# Patient Record
Sex: Male | Born: 1975 | Race: White | Hispanic: No | Marital: Married | Smoking: Never smoker
Health system: Southern US, Community
[De-identification: ages and names within clinical notes are randomized; demographics above are authoritative.]

## PROBLEM LIST (undated history)

## (undated) DIAGNOSIS — K219 Gastro-esophageal reflux disease without esophagitis: Secondary | ICD-10-CM

## (undated) DIAGNOSIS — J45909 Unspecified asthma, uncomplicated: Secondary | ICD-10-CM

## (undated) DIAGNOSIS — I10 Essential (primary) hypertension: Secondary | ICD-10-CM

## (undated) DIAGNOSIS — K589 Irritable bowel syndrome without diarrhea: Secondary | ICD-10-CM

## (undated) HISTORY — DX: Unspecified asthma, uncomplicated: J45.909

## (undated) HISTORY — DX: Gastro-esophageal reflux disease without esophagitis: K21.9

## (undated) HISTORY — DX: Irritable bowel syndrome, unspecified: K58.9

## (undated) HISTORY — DX: Essential (primary) hypertension: I10

## (undated) HISTORY — PX: TONSILLECTOMY: SUR1361

## (undated) HISTORY — PX: ROOT CANAL: SHX2363

---

## 2003-07-13 ENCOUNTER — Emergency Department (HOSPITAL_COMMUNITY): Admission: EM | Admit: 2003-07-13 | Discharge: 2003-07-13 | Payer: Self-pay | Admitting: Family Medicine

## 2013-02-17 ENCOUNTER — Emergency Department (HOSPITAL_COMMUNITY)
Admission: EM | Admit: 2013-02-17 | Discharge: 2013-02-17 | Disposition: A | Discharge status: Home / Self Care | Payer: PRIVATE HEALTH INSURANCE | Attending: Emergency Medicine | Admitting: Emergency Medicine

## 2013-02-17 ENCOUNTER — Encounter (HOSPITAL_COMMUNITY): Payer: Self-pay | Admitting: Emergency Medicine

## 2013-02-17 DIAGNOSIS — K089 Disorder of teeth and supporting structures, unspecified: Secondary | ICD-10-CM | POA: Insufficient documentation

## 2013-02-17 DIAGNOSIS — K0889 Other specified disorders of teeth and supporting structures: Secondary | ICD-10-CM

## 2013-02-17 MED ORDER — PENICILLIN V POTASSIUM 250 MG PO TABS
500.0000 mg | ORAL_TABLET | Freq: Once | ORAL | Status: AC
  Administered 2013-02-17: 500 mg via ORAL
  Filled 2013-02-17: qty 2

## 2013-02-17 MED ORDER — PENICILLIN V POTASSIUM 500 MG PO TABS: 500.0000 mg | ORAL_TABLET | Freq: Three times a day (TID) | ORAL | Status: DC

## 2013-02-17 MED ORDER — HYDROCODONE-ACETAMINOPHEN 5-325 MG PO TABS
1.0000 | ORAL_TABLET | Freq: Once | ORAL | Status: DC
  Filled 2013-02-17: qty 1

## 2013-02-17 MED ORDER — HYDROCODONE-ACETAMINOPHEN 5-325 MG PO TABS: 1.0000 | ORAL_TABLET | ORAL | Status: DC | PRN

## 2013-02-17 NOTE — ED Notes (Signed)
Pt c/o left upper toothache x 5 weeks. Pt reports broke tooth about 3 months ago. Pt has tried over the counter medication without relief.

## 2013-02-17 NOTE — ED Provider Notes (Signed)
CSN: [id number]     Arrival date & time 02/17/13  7829 History   First MD Initiated Contact with Patient 02/17/13 0840     No chief complaint on file.   HPI  Patient has a painful tooth. He states it broke about 3 months ago. Then progressively more sore. Started getting some intermittent sensation of swelling in his face over the last week. He called a scheduled appointment for a root canal. This is one week from today. States and more painful and he presents stating "I was hoping to get some antibiotics because it's getting worse".  No facial swelling. No difficult with swallowing or speech.  No past medical history on file. No past surgical history on file. No family history on file. History  Substance Use Topics  . Smoking status: Not on file  . Smokeless tobacco: Not on file  . Alcohol Use: Not on file    Review of Systems  HENT: Positive for dental problem. Negative for drooling, facial swelling and trouble swallowing.     Allergies  Review of patient's allergies indicates no known allergies.  Home Medications   Current Outpatient Rx  Name  Route  Sig  Dispense  Refill  . ibuprofen (ADVIL,MOTRIN) 200 MG tablet   Oral   Take 400 mg by mouth every 6 (six) hours as needed for moderate pain.         Marland Kitchen HYDROcodone-acetaminophen (NORCO/VICODIN) 5-325 MG per tablet   Oral   Take 1 tablet by mouth every 4 (four) hours as needed.   10 tablet   0   . penicillin v potassium (VEETID) 500 MG tablet   Oral   Take 1 tablet (500 mg total) by mouth 3 (three) times daily.   30 tablet   0    BP 142/104  Pulse 58  Temp(Src) 98.2 F (36.8 C) (Oral)  Resp 18  SpO2 98% Physical Exam  Physical exam shows him sitting upright. No acute distress. No facial swelling. No swelling or induration of the neck. No adenopathy in the neck. He has a fractured first molar. There is no periodontal reaction or swelling. No sign of. No abscess. No swelling medially. Adjacent teeth appear  normal. ED Course  Procedures (including critical care time) Labs Review Labs Reviewed - No data to display Imaging Review No results found.  EKG Interpretation   None       MDM   1. Pain, dental    No obvious gingival swelling. The tooth is stable. May be simple periodontitis infection. May be apical abscess. He has a scheduled appointment one week from today.    Roney Marion, MD 02/17/13 [phone]

## 2014-01-20 ENCOUNTER — Encounter (HOSPITAL_COMMUNITY): Payer: Self-pay | Admitting: Cardiology

## 2014-01-20 ENCOUNTER — Emergency Department (HOSPITAL_COMMUNITY)
Admission: EM | Admit: 2014-01-20 | Discharge: 2014-01-20 | Disposition: A | Discharge status: Home / Self Care | Payer: PRIVATE HEALTH INSURANCE | Attending: Emergency Medicine | Admitting: Emergency Medicine

## 2014-01-20 DIAGNOSIS — I10 Essential (primary) hypertension: Secondary | ICD-10-CM | POA: Insufficient documentation

## 2014-01-20 DIAGNOSIS — R51 Headache: Secondary | ICD-10-CM | POA: Insufficient documentation

## 2014-01-20 DIAGNOSIS — R112 Nausea with vomiting, unspecified: Secondary | ICD-10-CM | POA: Insufficient documentation

## 2014-01-20 DIAGNOSIS — Z79899 Other long term (current) drug therapy: Secondary | ICD-10-CM | POA: Insufficient documentation

## 2014-01-20 DIAGNOSIS — R519 Headache, unspecified: Secondary | ICD-10-CM

## 2014-01-20 DIAGNOSIS — Z792 Long term (current) use of antibiotics: Secondary | ICD-10-CM | POA: Insufficient documentation

## 2014-01-20 DIAGNOSIS — Z791 Long term (current) use of non-steroidal anti-inflammatories (NSAID): Secondary | ICD-10-CM | POA: Insufficient documentation

## 2014-01-20 LAB — URINALYSIS, ROUTINE W REFLEX MICROSCOPIC
Bilirubin Urine: NEGATIVE
Glucose, UA: NEGATIVE mg/dL
Ketones, ur: NEGATIVE mg/dL
LEUKOCYTES UA: NEGATIVE
NITRITE: NEGATIVE
Protein, ur: NEGATIVE mg/dL
SPECIFIC GRAVITY, URINE: 1.012 (ref 1.005–1.030)
UROBILINOGEN UA: 0.2 mg/dL (ref 0.0–1.0)
pH: 6 (ref 5.0–8.0)

## 2014-01-20 LAB — BASIC METABOLIC PANEL
ANION GAP: 14 (ref 5–15)
BUN: 13 mg/dL (ref 6–23)
CO2: 25 mEq/L (ref 19–32)
Calcium: 9.8 mg/dL (ref 8.4–10.5)
Chloride: 98 mEq/L (ref 96–112)
Creatinine, Ser: 0.95 mg/dL (ref 0.50–1.35)
GFR calc Af Amer: >90 mL/min (ref >90)
GFR calc non Af Amer: >90 mL/min (ref >90)
GLUCOSE: 111 mg/dL — AB (ref 70–99)
Potassium: 4.1 mEq/L (ref 3.7–5.3)
Sodium: 137 mEq/L (ref 137–147)

## 2014-01-20 LAB — CBC WITH DIFFERENTIAL/PLATELET
Basophils Absolute: 0 10*3/uL (ref 0.0–0.1)
Basophils Relative: 1 % (ref 0–1)
EOS ABS: 0.1 10*3/uL (ref 0.0–0.7)
Eosinophils Relative: 1 % (ref 0–5)
HCT: 44.3 % (ref 39.0–52.0)
HEMOGLOBIN: 14.9 g/dL (ref 13.0–17.0)
LYMPHS ABS: 3.4 10*3/uL (ref 0.7–4.0)
Lymphocytes Relative: 42 % (ref 12–46)
MCH: 29.3 pg (ref 26.0–34.0)
MCHC: 33.6 g/dL (ref 30.0–36.0)
MCV: 87.2 fL (ref 78.0–100.0)
MONOS PCT: 7 % (ref 3–12)
Monocytes Absolute: 0.6 10*3/uL (ref 0.1–1.0)
NEUTROS ABS: 4.1 10*3/uL (ref 1.7–7.7)
NEUTROS PCT: 49 % (ref 43–77)
PLATELETS: 263 10*3/uL (ref 150–400)
RBC: 5.08 MIL/uL (ref 4.22–5.81)
RDW: 12.4 % (ref 11.5–15.5)
WBC: 8.3 10*3/uL (ref 4.0–10.5)

## 2014-01-20 LAB — URINE MICROSCOPIC-ADD ON

## 2014-01-20 MED ORDER — PROCHLORPERAZINE EDISYLATE 5 MG/ML IJ SOLN
10.0000 mg | Freq: Once | INTRAMUSCULAR | Status: AC
  Administered 2014-01-20: 10 mg via INTRAVENOUS
  Filled 2014-01-20: qty 2

## 2014-01-20 MED ORDER — SODIUM CHLORIDE 0.9 % IV BOLUS (SEPSIS)
1000.0000 mL | Freq: Once | INTRAVENOUS | Status: AC
  Administered 2014-01-20: 1000 mL via INTRAVENOUS

## 2014-01-20 NOTE — ED Notes (Signed)
Pt reports his BP has been high over the past couple of days and developed a headache also. HCTZ 12.5 was RX in the past but stopped with diet control. Reports that he started taking it a couple of days ago.

## 2014-01-20 NOTE — ED Provider Notes (Signed)
CSN: [id number]     Arrival date & time 01/20/14  1806 History   First MD Initiated Contact with Patient 01/20/14 1839     Chief Complaint  Patient presents with  . Hypertension  . Headache     HPI  Patient presents with concern of headache, hypertension. Patient notes that over the past 2 years she has had episodes of nausea vomiting, headache.  Patient has previously been seen at [facility], with primary care, and was recently started on hydrochlorothiazide Patient notes that he has not been taking his medication until the past 2 or 3 days due to lower levels recently. Currently the patient describes right parietal soreness, nonradiating, with no visual loss, confusion, disorientation, asymmetric weakness or dysesthesia. Patient does complain of generalized discomfort, anxiety, changes in mood.   History reviewed. No pertinent past medical history. History reviewed. No pertinent past surgical history. History reviewed. No pertinent family history. History  Substance Use Topics  . Smoking status: Never Smoker   . Smokeless tobacco: Not on file  . Alcohol Use: Yes    Review of Systems  Neurological: Positive for headaches.      Allergies  Sulfa antibiotics  Home Medications   Prior to Admission medications   Medication Sig Start Date End Date Taking? Authorizing Provider  hydrochlorothiazide (MICROZIDE) 12.5 MG capsule Take 12.5 mg by mouth daily.   Yes Historical Provider, MD  ibuprofen (ADVIL,MOTRIN) 200 MG tablet Take 400 mg by mouth every 6 (six) hours as needed for moderate pain.   Yes Historical Provider, MD  HYDROcodone-acetaminophen (NORCO/VICODIN) 5-325 MG per tablet Take 1 tablet by mouth every 4 (four) hours as needed. Patient not taking: Reported on 01/20/2014 02/17/13   Rolland PorterMark James, MD  penicillin v potassium (VEETID) 500 MG tablet Take 1 tablet (500 mg total) by mouth 3 (three) times daily. Patient not taking: Reported on 01/20/2014 02/17/13   Rolland PorterMark  James, MD   BP 140/79 mmHg  Pulse 62  Temp(Src) 97.9 F (36.6 C) (Oral)  Resp 21  Ht 5\' 10"  (1.778 m)  Wt 225 lb (102.059 kg)  BMI 32.28 kg/m2  SpO2 98% Physical Exam  Constitutional: He is oriented to person, place, and time. He appears well-developed. No distress.  HENT:  Head: Normocephalic and atraumatic.  Eyes: Conjunctivae and EOM are normal.  Cardiovascular: Normal rate and regular rhythm.   Pulmonary/Chest: Effort normal. No stridor. No respiratory distress.  Abdominal: He exhibits no distension.  Musculoskeletal: He exhibits no edema.  Neurological: He is alert and oriented to person, place, and time. He is not disoriented. He displays no atrophy and no tremor. No cranial nerve deficit or sensory deficit. He exhibits normal muscle tone. He displays no seizure activity. Coordination normal.  Skin: Skin is warm and dry.  Psychiatric: He has a normal mood and affect.  Nursing note and vitals reviewed.   ED Course  Procedures (including critical care time) Labs Review Labs Reviewed  BASIC METABOLIC PANEL - Abnormal; Notable for the following:    Glucose, Bld 111 (*)    All other components within normal limits  URINALYSIS, ROUTINE W REFLEX MICROSCOPIC - Abnormal; Notable for the following:    Hgb urine dipstick TRACE (*)    All other components within normal limits  CBC WITH DIFFERENTIAL  URINE MICROSCOPIC-ADD ON      EKG Interpretation   Date/Time:  Saturday January 20 2014 19:26:05 EST Ventricular Rate:  64 PR Interval:  168 QRS Duration: 107 QT Interval:  408  QTC Calculation: 421 R Axis:   4 Text Interpretation:  Sinus rhythm Low voltage, precordial leads RSR' in  V1 or V2, right VCD or RVH Sinus rhythm Artifact Non-specific  intra-ventricular conduction delay Abnormal ekg Confirmed by Gerhard MunchLOCKWOOD,  Kelilah Hebard  MD [phone]([id number]) on 01/20/2014 8:47:09 PM     9:14 PM Patient asymptomatic MDM  Patient presents with headache, blood pressure issues. Patient is  neurologically intact, hemodynamically stable.  Labs have mild abnormalities, but no substantial evidence for end organ damage. Patient has complete resolution of his headache. Patient was discharged stable condition to follow-up with primary care for further evaluation, management of headaches.  Patient was also provided neurology follow-up.    Gerhard Munchobert Melven Stockard, MD 01/20/14 2114

## 2014-01-20 NOTE — Discharge Instructions (Signed)
Please be sure to follow-up with your primary care physician.  If you need additional evaluation in regards to headaches, please use the provided referral.   Return here for concerning changes in your condition.

## 2014-01-31 ENCOUNTER — Emergency Department (HOSPITAL_COMMUNITY): Payer: PRIVATE HEALTH INSURANCE

## 2014-01-31 ENCOUNTER — Encounter (HOSPITAL_COMMUNITY): Payer: Self-pay | Admitting: *Deleted

## 2014-01-31 ENCOUNTER — Emergency Department (HOSPITAL_COMMUNITY)
Admission: EM | Admit: 2014-01-31 | Discharge: 2014-01-31 | Disposition: A | Discharge status: Home / Self Care | Payer: PRIVATE HEALTH INSURANCE | Attending: Emergency Medicine | Admitting: Emergency Medicine

## 2014-01-31 DIAGNOSIS — K219 Gastro-esophageal reflux disease without esophagitis: Secondary | ICD-10-CM | POA: Insufficient documentation

## 2014-01-31 DIAGNOSIS — K047 Periapical abscess without sinus: Secondary | ICD-10-CM | POA: Insufficient documentation

## 2014-01-31 DIAGNOSIS — R0602 Shortness of breath: Secondary | ICD-10-CM

## 2014-01-31 DIAGNOSIS — R05 Cough: Secondary | ICD-10-CM | POA: Insufficient documentation

## 2014-01-31 DIAGNOSIS — Z792 Long term (current) use of antibiotics: Secondary | ICD-10-CM | POA: Insufficient documentation

## 2014-01-31 DIAGNOSIS — Z79899 Other long term (current) drug therapy: Secondary | ICD-10-CM | POA: Insufficient documentation

## 2014-01-31 DIAGNOSIS — R112 Nausea with vomiting, unspecified: Secondary | ICD-10-CM | POA: Insufficient documentation

## 2014-01-31 DIAGNOSIS — J069 Acute upper respiratory infection, unspecified: Secondary | ICD-10-CM | POA: Insufficient documentation

## 2014-01-31 LAB — COMPREHENSIVE METABOLIC PANEL
ALBUMIN: 4.2 g/dL (ref 3.5–5.2)
ALK PHOS: 63 U/L (ref 39–117)
ALT: 19 U/L (ref 0–53)
AST: 16 U/L (ref 0–37)
Anion gap: 14 (ref 5–15)
BILIRUBIN TOTAL: 0.4 mg/dL (ref 0.3–1.2)
BUN: 14 mg/dL (ref 6–23)
CHLORIDE: 98 meq/L (ref 96–112)
CO2: 26 meq/L (ref 19–32)
Calcium: 9.4 mg/dL (ref 8.4–10.5)
Creatinine, Ser: 1.06 mg/dL (ref 0.50–1.35)
GFR calc Af Amer: >90 mL/min (ref >90)
GFR calc non Af Amer: 87 mL/min — ABNORMAL LOW (ref >90)
Glucose, Bld: 103 mg/dL — ABNORMAL HIGH (ref 70–99)
POTASSIUM: 3.8 meq/L (ref 3.7–5.3)
Sodium: 138 mEq/L (ref 137–147)
Total Protein: 7.7 g/dL (ref 6.0–8.3)

## 2014-01-31 LAB — CBC WITH DIFFERENTIAL/PLATELET
BASOS ABS: 0 10*3/uL (ref 0.0–0.1)
Basophils Relative: 1 % (ref 0–1)
Eosinophils Absolute: 0 10*3/uL (ref 0.0–0.7)
Eosinophils Relative: 0 % (ref 0–5)
HCT: 43.9 % (ref 39.0–52.0)
Hemoglobin: 15.2 g/dL (ref 13.0–17.0)
LYMPHS PCT: 35 % (ref 12–46)
Lymphs Abs: 2.4 10*3/uL (ref 0.7–4.0)
MCH: 30.4 pg (ref 26.0–34.0)
MCHC: 34.6 g/dL (ref 30.0–36.0)
MCV: 87.8 fL (ref 78.0–100.0)
Monocytes Absolute: 0.4 10*3/uL (ref 0.1–1.0)
Monocytes Relative: 6 % (ref 3–12)
NEUTROS ABS: 4 10*3/uL (ref 1.7–7.7)
NEUTROS PCT: 58 % (ref 43–77)
Platelets: 272 10*3/uL (ref 150–400)
RBC: 5 MIL/uL (ref 4.22–5.81)
RDW: 12.3 % (ref 11.5–15.5)
WBC: 6.9 10*3/uL (ref 4.0–10.5)

## 2014-01-31 LAB — URINALYSIS, ROUTINE W REFLEX MICROSCOPIC
Bilirubin Urine: NEGATIVE
GLUCOSE, UA: NEGATIVE mg/dL
Hgb urine dipstick: NEGATIVE
KETONES UR: 15 mg/dL — AB
LEUKOCYTES UA: NEGATIVE
NITRITE: NEGATIVE
Protein, ur: NEGATIVE mg/dL
Specific Gravity, Urine: 1.019 (ref 1.005–1.030)
Urobilinogen, UA: 0.2 mg/dL (ref 0.0–1.0)
pH: 5.5 (ref 5.0–8.0)

## 2014-01-31 LAB — LIPASE, BLOOD: LIPASE: 18 U/L (ref 11–59)

## 2014-01-31 MED ORDER — OMEPRAZOLE 20 MG PO CPDR: 20.0000 mg | DELAYED_RELEASE_CAPSULE | Freq: Every day | ORAL | Status: DC

## 2014-01-31 MED ORDER — SODIUM CHLORIDE 0.9 % IV BOLUS (SEPSIS)
1000.0000 mL | Freq: Once | INTRAVENOUS | Status: AC
  Administered 2014-01-31: 1000 mL via INTRAVENOUS

## 2014-01-31 MED ORDER — AEROCHAMBER PLUS FLO-VU LARGE MISC
1.0000 | Freq: Once | Status: AC
  Administered 2014-01-31: 1
  Filled 2014-01-31 (×3): qty 1

## 2014-01-31 MED ORDER — ALBUTEROL SULFATE HFA 108 (90 BASE) MCG/ACT IN AERS
2.0000 | INHALATION_SPRAY | Freq: Once | RESPIRATORY_TRACT | Status: AC
  Administered 2014-01-31: 2 via RESPIRATORY_TRACT
  Filled 2014-01-31: qty 6.7

## 2014-01-31 MED ORDER — PANTOPRAZOLE SODIUM 40 MG IV SOLR
40.0000 mg | Freq: Once | INTRAVENOUS | Status: AC
  Administered 2014-01-31: 40 mg via INTRAVENOUS
  Filled 2014-01-31: qty 40

## 2014-01-31 MED ORDER — BENZONATATE 100 MG PO CAPS
100.0000 mg | ORAL_CAPSULE | Freq: Once | ORAL | Status: AC
  Administered 2014-01-31: 100 mg via ORAL
  Filled 2014-01-31: qty 1

## 2014-01-31 NOTE — ED Provider Notes (Signed)
CSN: [id number]     Arrival date & time 01/31/14  1027 History   First MD Initiated Contact with Patient 01/31/14 1202     Chief Complaint  Patient presents with  . Shortness of Breath  . Cough   Kevin Pennington is a 38 y.o. male with a hx of hypertension, asthma, and H. Pylori who presents to the ED complaining of shortness of breath on exertion for the past one half weeks, cough for 3 days, and nausea and vomiting for the past 2 weeks. Patient reports that he's been feeling short of breath while exercising. He does report some wheezing with exercise. He denies current shortness of breath. He reports having a cough and spitting up white stuff for the past 3 days.  He reports his cough is worse at night and when lying down. He reports eating fatty foods last night for dinner and breakfast this morning and vomiting this up. He reports vomiting 10-20 minutes after eating for the past week. He reports having positive test reflux previously but no longer takes proton pump inhibitor. He reports being treated for H. Pylori previously.  He reports he was started on penicillin a week ago for dental pain which is now resolved. He reports some musculoskeletal bilateral low back pain for 2 weeks. He denies fevers, chills, abdominal pain, rashes, pleuritic pain, chest pain, trouble swallowing, nasal congestion, hematemesis, hematuria, dysuria, melena, or hematochezia. He denies personal or family history of DVT or PE. Denies recent long travel.  (Consider location/radiation/quality/duration/timing/severity/associated sxs/prior Treatment) HPI  History reviewed. No pertinent past medical history. History reviewed. No pertinent past surgical history. History reviewed. No pertinent family history. History  Substance Use Topics  . Smoking status: Never Smoker   . Smokeless tobacco: Not on file  . Alcohol Use: Yes    Review of Systems  Constitutional: Negative for fever, chills and appetite change.  HENT:  Negative for congestion, ear discharge, ear pain, rhinorrhea, sinus pressure, sneezing, sore throat and trouble swallowing.   Eyes: Negative for pain, redness and visual disturbance.  Respiratory: Positive for cough, shortness of breath and wheezing. Negative for chest tightness.   Cardiovascular: Negative for chest pain, palpitations and leg swelling.  Gastrointestinal: Positive for nausea and vomiting. Negative for abdominal pain, diarrhea and blood in stool.  Genitourinary: Negative for dysuria, urgency, frequency, hematuria, decreased urine volume and difficulty urinating.  Musculoskeletal: Negative for myalgias, back pain, joint swelling, neck pain and neck stiffness.  Skin: Negative for color change, pallor, rash and wound.  Neurological: Negative for dizziness, weakness, light-headedness, numbness and headaches.  All other systems reviewed and are negative.     Allergies  Sulfa antibiotics  Home Medications   Prior to Admission medications   Medication Sig Start Date End Date Taking? Authorizing Provider  hydrochlorothiazide (MICROZIDE) 12.5 MG capsule Take 12.5 mg by mouth daily.   Yes Historical Provider, MD  penicillin v potassium (VEETID) 500 MG tablet Take 1 tablet (500 mg total) by mouth 3 (three) times daily. 02/17/13  Yes Rolland Porter, MD  HYDROcodone-acetaminophen (NORCO/VICODIN) 5-325 MG per tablet Take 1 tablet by mouth every 4 (four) hours as needed. Patient not taking: Reported on 01/20/2014 02/17/13   Rolland Porter, MD  omeprazole (PRILOSEC) 20 MG capsule Take 1 capsule (20 mg total) by mouth daily. 01/31/14   Einar Gip Oluwasemilore Pascuzzi, PA-C   BP 120/88 mmHg  Pulse 85  Resp 22  SpO2 100% Physical Exam  Constitutional: He appears well-developed and well-nourished. No distress.  HENT:  Head: Normocephalic and atraumatic.  Right Ear: External ear normal.  Left Ear: External ear normal.  Nose: Nose normal.  Mouth/Throat: Oropharynx is clear and moist. No oropharyngeal  exudate.  Eyes: Conjunctivae are normal. Pupils are equal, round, and reactive to light. Right eye exhibits no discharge. Left eye exhibits no discharge. No scleral icterus.  Neck: Normal range of motion. Neck supple.  Cardiovascular: Normal rate, regular rhythm, normal heart sounds and intact distal pulses.  Exam reveals no gallop and no friction rub.   No murmur heard. Pulmonary/Chest: Effort normal and breath sounds normal. No respiratory distress. He has no wheezes. He has no rales. He exhibits no tenderness.  Lungs are clear bilaterally. No Rales, wheezing or rhonchi.  Abdominal: Soft. Bowel sounds are normal. He exhibits no distension and no mass. There is no tenderness. There is no rebound and no guarding.  Abdomen is soft and nontender to palpation. No rebound tenderness. Negative Murphy sign. Negative psoas and obturator sign. Negative Rovsing sign.  Musculoskeletal: He exhibits no edema.  Lymphadenopathy:    He has no cervical adenopathy.  Neurological: He is alert. Coordination normal.  Skin: Skin is warm and dry. No rash noted. He is not diaphoretic. No erythema. No pallor.  Psychiatric: He has a normal mood and affect. His behavior is normal.  Nursing note and vitals reviewed.   ED Course  Procedures (including critical care time) Labs Review Labs Reviewed  URINALYSIS, ROUTINE W REFLEX MICROSCOPIC - Abnormal; Notable for the following:    Ketones, ur 15 (*)    All other components within normal limits  COMPREHENSIVE METABOLIC PANEL - Abnormal; Notable for the following:    Glucose, Bld 103 (*)    GFR calc non Af Amer 87 (*)    All other components within normal limits  CBC WITH DIFFERENTIAL  LIPASE, BLOOD    Imaging Review Dg Chest 2 View  01/31/2014   CLINICAL DATA:  Shortness of breath.  Chest tightness.  EXAM: CHEST  2 VIEW  COMPARISON:  01/15/2014  FINDINGS: The heart size and mediastinal contours are within normal limits. Both lungs are clear. The visualized  skeletal structures are unremarkable.  IMPRESSION: Normal exam.   Electronically Signed   By: Geanie CooleyJim  Maxwell M.D.   On: 01/31/2014 11:23     EKG Interpretation   Date/Time:  Wednesday January 31 2014 10:36:19 EST Ventricular Rate:  76 PR Interval:  168 QRS Duration: 94 QT Interval:  366 QTC Calculation: 411 R Axis:     Text Interpretation:  Sinus rhythm with Fusion complexes Low voltage QRS  Borderline ECG No significant change was found Confirmed by Manus GunningANCOUR  MD,  STEPHEN ([id number]) on 01/31/2014 2:46:57 PM     Filed Vitals:   01/31/14 1415 01/31/14 1430 01/31/14 1451 01/31/14 1515  BP: 121/84 117/76 124/71 120/88  Pulse: 64 75 72 85  Resp: 16 19 20 22   SpO2: 100% 100% 100% 100%    MDM   Meds given in ED:  Medications  sodium chloride 0.9 % bolus 1,000 mL (0 mLs Intravenous Stopped 01/31/14 1536)  benzonatate (TESSALON) capsule 100 mg (100 mg Oral Given 01/31/14 1355)  pantoprazole (PROTONIX) injection 40 mg (40 mg Intravenous Given 01/31/14 1413)  albuterol (PROVENTIL HFA;VENTOLIN HFA) 108 (90 BASE) MCG/ACT inhaler 2 puff (2 puffs Inhalation Given 01/31/14 1529)  AEROCHAMBER PLUS FLO-VU LARGE MISC 1 each (1 each Other Given 01/31/14 1530)    Discharge Medication List as of 01/31/2014  3:31 PM    START  taking these medications   Details  omeprazole (PRILOSEC) 20 MG capsule Take 1 capsule (20 mg total) by mouth daily., Starting 01/31/2014, Until Discontinued, Print        Final diagnoses:  Upper respiratory infection  Gastroesophageal reflux disease, esophagitis presence not specified   Lisbeth PlyJonathan Marcott is a 38 y.o. male with a hx of hypertension, asthma, and H. Pylori who presents to the ED complaining of shortness of breath on exertion for the past one half weeks, cough for 3 days, and nausea and vomiting for the past 2 weeks. Patient is afebrile, his oxygen saturation 100% on room air and his abdomen is non-tender to palpation. He is not tachycardic. He has a normal  EKG.  His lungs are clear on exam and has no rales, wheezing or rhonchi on exam. He denies shortness of breath currently. He denies chest pain or palpitations. ACS or pulmonary embolism is thus unlikely. The patient's urinalysis, CBC, CMP, lipase and chest x-ray are all unremarkable. The patient was given fluids and Protonix IV in the ED. Patient then tolerated crackers and juice. He denies any nausea or vomiting after eating.  The patient was provided an albuterol inhaler with AeroChamber and instructed how to use it. Advised he could use it as needed for wheezing. I advised that he had any difficulty breathing he needed to return to the ED immediately. The patient's cough nausea vomiting are likely related to his GERD as he is able to tolerate food after taking Protonix. We'll discharge this patient with omeprazole and a follow-up with his PCP. Patient states that he contacted his primary care provider who is setting him up with a gastroenterologist. I advised him to follow-up with his primary care provider this week. I advised him to return to the ED with new or worsening symptoms or new concerns or with any difficulty breathing. The patient verbalized understanding and agreement with plan.  This patient was discussed with and evaluated by Trixie DredgeEmily West PA who agrees with assessment and plan.    Lawana ChambersWilliam Duncan Alayasia Breeding, PA-C 01/31/14 1733  Glynn OctaveStephen Rancour, MD 01/31/14 [phone]

## 2014-01-31 NOTE — ED Notes (Signed)
Pt made aware to return if symptoms worsen or if any life threatening symptoms occur.   

## 2014-01-31 NOTE — ED Notes (Signed)
Pt given Coke and crackers for PO Challenge

## 2014-01-31 NOTE — Discharge Instructions (Signed)
Gastroesophageal Reflux Disease, Adult °Gastroesophageal reflux disease (GERD) happens when acid from your stomach flows up into the esophagus. When acid comes in contact with the esophagus, the acid causes soreness (inflammation) in the esophagus. Over time, GERD may create small holes (ulcers) in the lining of the esophagus. °CAUSES  °· Increased body weight. This puts pressure on the stomach, making acid rise from the stomach into the esophagus. °· Smoking. This increases acid production in the stomach. °· Drinking alcohol. This causes decreased pressure in the lower esophageal sphincter (valve or ring of muscle between the esophagus and stomach), allowing acid from the stomach into the esophagus. °· Late evening meals and a full stomach. This increases pressure and acid production in the stomach. °· A malformed lower esophageal sphincter. °Sometimes, no cause is found. °SYMPTOMS  °· Burning pain in the lower part of the mid-chest behind the breastbone and in the mid-stomach area. This may occur twice a week or more often. °· Trouble swallowing. °· Sore throat. °· Dry cough. °· Asthma-like symptoms including chest tightness, shortness of breath, or wheezing. °DIAGNOSIS  °Your caregiver may be able to diagnose GERD based on your symptoms. In some cases, X-rays and other tests may be done to check for complications or to check the condition of your stomach and esophagus. °TREATMENT  °Your caregiver may recommend over-the-counter or prescription medicines to help decrease acid production. Ask your caregiver before starting or adding any new medicines.  °HOME CARE INSTRUCTIONS  °· Change the factors that you can control. Ask your caregiver for guidance concerning weight loss, quitting smoking, and alcohol consumption. °· Avoid foods and drinks that make your symptoms worse, such as: °· Caffeine or alcoholic drinks. °· Chocolate. °· Peppermint or mint flavorings. °· Garlic and onions. °· Spicy foods. °· Citrus fruits,  such as oranges, lemons, or limes. °· Tomato-based foods such as sauce, chili, salsa, and pizza. °· Fried and fatty foods. °· Avoid lying down for the 3 hours prior to your bedtime or prior to taking a nap. °· Eat small, frequent meals instead of large meals. °· Wear loose-fitting clothing. Do not wear anything tight around your waist that causes pressure on your stomach. °· Raise the head of your bed 6 to 8 inches with wood blocks to help you sleep. Extra pillows will not help. °· Only take over-the-counter or prescription medicines for pain, discomfort, or fever as directed by your caregiver. °· Do not take aspirin, ibuprofen, or other nonsteroidal anti-inflammatory drugs (NSAIDs). °SEEK IMMEDIATE MEDICAL CARE IF:  °· You have pain in your arms, neck, jaw, teeth, or back. °· Your pain increases or changes in intensity or duration. °· You develop nausea, vomiting, or sweating (diaphoresis). °· You develop shortness of breath, or you faint. °· Your vomit is green, yellow, black, or looks like coffee grounds or blood. °· Your stool is red, bloody, or black. °These symptoms could be signs of other problems, such as heart disease, gastric bleeding, or esophageal bleeding. °MAKE SURE YOU:  °· Understand these instructions. °· Will watch your condition. °· Will get help right away if you are not doing well or get worse. °Document Released: 12/10/2004 Document Revised: 05/25/2011 Document Reviewed: 09/19/2010 °ExitCare® Patient Information ©2015 [employer]. This information is not intended to replace advice given to you by your health care provider. Make sure you discuss any questions you have with your health care provider. ° °Upper Respiratory Infection, Adult °An upper respiratory infection (URI) is also sometimes known as the   common cold. The upper respiratory tract includes the nose, sinuses, throat, trachea, and bronchi. Bronchi are the airways leading to the lungs. Most people improve within 1 week, but symptoms  can last up to 2 weeks. A residual cough may last even longer.  °CAUSES °Many different viruses can infect the tissues lining the upper respiratory tract. The tissues become irritated and inflamed and often become very moist. Mucus production is also common. A cold is contagious. You can easily spread the virus to others by oral contact. This includes kissing, sharing a glass, coughing, or sneezing. Touching your mouth or nose and then touching a surface, which is then touched by another person, can also spread the virus. °SYMPTOMS  °Symptoms typically develop 1 to 3 days after you come in contact with a cold virus. Symptoms vary from person to person. They may include: °· Runny nose. °· Sneezing. °· Nasal congestion. °· Sinus irritation. °· Sore throat. °· Loss of voice (laryngitis). °· Cough. °· Fatigue. °· Muscle aches. °· Loss of appetite. °· Headache. °· Low-grade fever. °DIAGNOSIS  °You might diagnose your own cold based on familiar symptoms, since most people get a cold 2 to 3 times a year. Your caregiver can confirm this based on your exam. Most importantly, your caregiver can check that your symptoms are not due to another disease such as strep throat, sinusitis, pneumonia, asthma, or epiglottitis. Blood tests, throat tests, and X-rays are not necessary to diagnose a common cold, but they may sometimes be helpful in excluding other more serious diseases. Your caregiver will decide if any further tests are required. °RISKS AND COMPLICATIONS  °You may be at risk for a more severe case of the common cold if you smoke cigarettes, have chronic heart disease (such as heart failure) or lung disease (such as asthma), or if you have a weakened immune system. The very young and very old are also at risk for more serious infections. Bacterial sinusitis, middle ear infections, and bacterial pneumonia can complicate the common cold. The common cold can worsen asthma and chronic obstructive pulmonary disease (COPD).  Sometimes, these complications can require emergency medical care and may be life-threatening. °PREVENTION  °The best way to protect against getting a cold is to practice good hygiene. Avoid oral or hand contact with people with cold symptoms. Wash your hands often if contact occurs. There is no clear evidence that vitamin C, vitamin E, echinacea, or exercise reduces the chance of developing a cold. However, it is always recommended to get plenty of rest and practice good nutrition. °TREATMENT  °Treatment is directed at relieving symptoms. There is no cure. Antibiotics are not effective, because the infection is caused by a virus, not by bacteria. Treatment may include: °· Increased fluid intake. Sports drinks offer valuable electrolytes, sugars, and fluids. °· Breathing heated mist or steam (vaporizer or shower). °· Eating chicken soup or other clear broths, and maintaining good nutrition. °· Getting plenty of rest. °· Using gargles or lozenges for comfort. °· Controlling fevers with ibuprofen or acetaminophen as directed by your caregiver. °· Increasing usage of your inhaler if you have asthma. °Zinc gel and zinc lozenges, taken in the first 24 hours of the common cold, can shorten the duration and lessen the severity of symptoms. Pain medicines may help with fever, muscle aches, and throat pain. A variety of non-prescription medicines are available to treat congestion and runny nose. Your caregiver can make recommendations and may suggest nasal or lung inhalers for other symptoms.  °  HOME CARE INSTRUCTIONS  °· Only take over-the-counter or prescription medicines for pain, discomfort, or fever as directed by your caregiver. °· Use a warm mist humidifier or inhale steam from a shower to increase air moisture. This may keep secretions moist and make it easier to breathe. °· Drink enough water and fluids to keep your urine clear or pale yellow. °· Rest as needed. °· Return to work when your temperature has returned to  normal or as your caregiver advises. You may need to stay home longer to avoid infecting others. You can also use a face mask and careful hand washing to prevent spread of the virus. °SEEK MEDICAL CARE IF:  °· After the first few days, you feel you are getting worse rather than better. °· You need your caregiver's advice about medicines to control symptoms. °· You develop chills, worsening shortness of breath, or brown or red sputum. These may be signs of pneumonia. °· You develop yellow or brown nasal discharge or pain in the face, especially when you bend forward. These may be signs of sinusitis. °· You develop a fever, swollen neck glands, pain with swallowing, or white areas in the back of your throat. These may be signs of strep throat. °SEEK IMMEDIATE MEDICAL CARE IF:  °· You have a fever. °· You develop severe or persistent headache, ear pain, sinus pain, or chest pain. °· You develop wheezing, a prolonged cough, cough up blood, or have a change in your usual mucus (if you have chronic lung disease). °· You develop sore muscles or a stiff neck. °Document Released: 08/26/2000 Document Revised: 05/25/2011 Document Reviewed: 06/07/2013 °ExitCare® Patient Information ©2015 [employer]. This information is not intended to replace advice given to you by your health care provider. Make sure you discuss any questions you have with your health care provider. ° °

## 2014-01-31 NOTE — ED Notes (Signed)
Pt reports being seen here one week ago for same. Has been on amoxicillin for the past week and still has cough and sob. Airway intact, ekg done at triage.

## 2014-01-31 NOTE — ED Notes (Signed)
Pt reports SOB since Monday, especially with exercise.  Pt has been spitting up "white stuff" all week and was seen last week at the doctor for blood pressure medication.  Pt also reports bilateral flank pain with no burning or blood with urination.  Pt taking antibiotics for abscessed tooth.

## 2014-10-13 ENCOUNTER — Emergency Department (HOSPITAL_COMMUNITY)
Admission: EM | Admit: 2014-10-13 | Discharge: 2014-10-13 | Disposition: A | Discharge status: Home / Self Care | Payer: 59 | Attending: Emergency Medicine | Admitting: Emergency Medicine

## 2014-10-13 ENCOUNTER — Encounter (HOSPITAL_COMMUNITY): Payer: Self-pay | Admitting: *Deleted

## 2014-10-13 ENCOUNTER — Emergency Department (HOSPITAL_COMMUNITY): Payer: 59

## 2014-10-13 DIAGNOSIS — Y998 Other external cause status: Secondary | ICD-10-CM | POA: Insufficient documentation

## 2014-10-13 DIAGNOSIS — W01198A Fall on same level from slipping, tripping and stumbling with subsequent striking against other object, initial encounter: Secondary | ICD-10-CM | POA: Insufficient documentation

## 2014-10-13 DIAGNOSIS — Z79899 Other long term (current) drug therapy: Secondary | ICD-10-CM | POA: Insufficient documentation

## 2014-10-13 DIAGNOSIS — S29001A Unspecified injury of muscle and tendon of front wall of thorax, initial encounter: Secondary | ICD-10-CM | POA: Diagnosis not present

## 2014-10-13 DIAGNOSIS — S3992XA Unspecified injury of lower back, initial encounter: Secondary | ICD-10-CM | POA: Diagnosis not present

## 2014-10-13 DIAGNOSIS — Z791 Long term (current) use of non-steroidal anti-inflammatories (NSAID): Secondary | ICD-10-CM | POA: Insufficient documentation

## 2014-10-13 DIAGNOSIS — Y9389 Activity, other specified: Secondary | ICD-10-CM | POA: Insufficient documentation

## 2014-10-13 DIAGNOSIS — R071 Chest pain on breathing: Secondary | ICD-10-CM

## 2014-10-13 DIAGNOSIS — Y9289 Other specified places as the place of occurrence of the external cause: Secondary | ICD-10-CM | POA: Insufficient documentation

## 2014-10-13 DIAGNOSIS — R0789 Other chest pain: Secondary | ICD-10-CM

## 2014-10-13 MED ORDER — NAPROXEN 500 MG PO TABS: 500.0000 mg | ORAL_TABLET | Freq: Two times a day (BID) | ORAL | Status: DC

## 2014-10-13 NOTE — ED Provider Notes (Signed)
CSN: [id number]     Arrival date & time 10/13/14  1611 History  This chart was scribed for Fayrene Helper, PA-C, working with Eber Hong, MD by Chestine Spore, ED Scribe. The patient was seen in room TR10C/TR10C at 5:02 PM.    Chief Complaint  Patient presents with  . Fall      The history is provided by the patient. No language interpreter was used.    HPI Comments: Kevin Pennington is a 39 y.o. male who presents to the Emergency Department complaining of fall onset 1 week ago. Pt notes that he tripped on his shoe strings and had a mechanical fall off his porch onto his left side. Pt denies being intoxicated at the time. Pt notes that he heard a pop when the incident occurred and there was no initial pain until the next day. Pt notes that when he breathes deep there is pain to his left rib pain. He states that he is having associated symptoms of left rib pain, SOB, cough, left sided back pain x 2 weeks. He reports that the back pain was spontaneous without an injury. He states that he has tried OTC tylenol with no relief for his symptoms. He denies LOC, abdominal pain, and any other symptoms.   History reviewed. No pertinent past medical history. History reviewed. No pertinent past surgical history. No family history on file. History  Substance Use Topics  . Smoking status: Never Smoker   . Smokeless tobacco: Not on file  . Alcohol Use: Yes    Review of Systems  Respiratory: Positive for cough and shortness of breath.   Gastrointestinal: Negative for abdominal pain.  Musculoskeletal: Positive for back pain and arthralgias.  Neurological: Negative for syncope.      Allergies  Sulfa antibiotics  Home Medications   Prior to Admission medications   Medication Sig Start Date End Date Taking? Authorizing Provider  hydrochlorothiazide (MICROZIDE) 12.5 MG capsule Take 12.5 mg by mouth daily.    Historical Provider, MD  HYDROcodone-acetaminophen (NORCO/VICODIN) 5-325 MG per tablet Take 1  tablet by mouth every 4 (four) hours as needed. Patient not taking: Reported on 01/20/2014 02/17/13   Rolland Porter, MD  omeprazole (PRILOSEC) 20 MG capsule Take 1 capsule (20 mg total) by mouth daily. 01/31/14   Everlene Farrier, PA-C  penicillin v potassium (VEETID) 500 MG tablet Take 1 tablet (500 mg total) by mouth 3 (three) times daily. 02/17/13   Rolland Porter, MD   BP 144/90 mmHg  Pulse 67  Temp(Src) 97.9 F (36.6 C) (Oral)  Resp 16  Ht 5\' 10"  (1.778 m)  Wt 230 lb (104.327 kg)  BMI 33.00 kg/m2  SpO2 97% Physical Exam  Constitutional: He is oriented to person, place, and time. He appears well-developed and well-nourished. No distress.  HENT:  Head: Normocephalic and atraumatic.  Eyes: EOM are normal.  Neck: Neck supple. No tracheal deviation present.  Cardiovascular: Normal rate, regular rhythm and normal heart sounds.   Pulmonary/Chest: Effort normal and breath sounds normal. No respiratory distress.  Tenderness noted to left anterior lateral ribs on palpation with no crepitus or emphysema. No overlying skin changes.   Abdominal: Soft. There is no tenderness.  Musculoskeletal: Normal range of motion.  no significant midline spinal tenderness   Neurological: He is alert and oriented to person, place, and time.  Skin: Skin is warm and dry.  Psychiatric: He has a normal mood and affect. His behavior is normal.  Nursing note and vitals reviewed.   ED  Course  Procedures (including critical care time) DIAGNOSTIC STUDIES: Oxygen Saturation is 97% on RA, nl by my interpretation.    COORDINATION OF CARE: 5:08 PM-Discussed treatment plan which includes X-ray of left ribs unilateral with chest, with pt at bedside and pt agreed to plan.   6:12 PM Xray of L ribs are unremarkable.  No evidence of acute fx or PTX.  Reassurance given. RICE therapy discussed.  Naproxen prescribed for pain.   Labs Review Labs Reviewed - No data to display  Imaging Review Dg Ribs Unilateral W/chest  Left  10/13/2014   CLINICAL DATA:  Fall down steps landing on left side 1 week ago with left anterior rib pain. Ongoing cough 3 months.  EXAM: LEFT RIBS AND CHEST - 3+ VIEW  COMPARISON:  Chest x-ray 01/31/2014  FINDINGS: The lungs are somewhat hypoinflated and otherwise clear. Cardiomediastinal silhouette is within normal. No acute rib fracture.  IMPRESSION: No acute findings.   Electronically Signed   By: Elberta Fortis M.D.   On: 10/13/2014 18:06     EKG Interpretation None      MDM   Final diagnoses:  Costochondral chest pain    BP 144/90 mmHg  Pulse 67  Temp(Src) 97.9 F (36.6 C) (Oral)  Resp 16  Ht  (1.778 m)  Wt 230 lb (104.327 kg)  BMI 33.00 kg/m2  SpO2 97%  I have reviewed nursing notes and vital signs. I personally viewed the imaging tests through PACS system and agrees with radiologist's intepretation I reviewed available ER/hospitalization records through the EMR   I personally performed the services described in this documentation, which was scribed in my presence. The recorded information has been reviewed and is accurate.     Fayrene Helper, PA-C 10/13/14 1817  Eber Hong, MD 10/14/14 [phone]

## 2014-10-13 NOTE — ED Notes (Signed)
Pt reports a fall about 1 week ago reports left rib pain from the fall. Pt has bilateral breath sounds.

## 2014-10-13 NOTE — ED Notes (Signed)
Declined W/C at D/C and was escorted to lobby by RN. 

## 2014-10-13 NOTE — Discharge Instructions (Signed)

## 2017-04-20 DIAGNOSIS — I10 Essential (primary) hypertension: Secondary | ICD-10-CM | POA: Insufficient documentation

## 2017-04-20 DIAGNOSIS — R7303 Prediabetes: Secondary | ICD-10-CM | POA: Insufficient documentation

## 2017-07-30 ENCOUNTER — Encounter: Payer: Self-pay | Admitting: Gastroenterology

## 2017-08-30 ENCOUNTER — Encounter: Payer: Self-pay | Admitting: Gastroenterology

## 2017-08-30 DIAGNOSIS — K589 Irritable bowel syndrome without diarrhea: Secondary | ICD-10-CM | POA: Insufficient documentation

## 2017-08-31 ENCOUNTER — Ambulatory Visit (INDEPENDENT_AMBULATORY_CARE_PROVIDER_SITE_OTHER): Payer: Managed Care, Other (non HMO) | Admitting: Gastroenterology

## 2017-08-31 ENCOUNTER — Encounter

## 2017-08-31 ENCOUNTER — Encounter: Payer: Self-pay | Admitting: Gastroenterology

## 2017-08-31 DIAGNOSIS — K580 Irritable bowel syndrome with diarrhea: Secondary | ICD-10-CM

## 2017-08-31 NOTE — Progress Notes (Signed)
IMPRESSION and PLAN:    #1. Rectal Bleeding. Differential diagnosis includes hemorrhoids, AVMs, colitis, polyps, rule out colonic neoplasms or IBD. #2.  Diarrhea (normal TSH and negative stool studies in the past, failed trial of Bentyl) #3.  FH of polyps (mom) - Proceed with colonoscopy.  I have discussed the risks and benefits.  The risks including risk of perforation requiring laparotomy, bleeding after polypectomy requiring blood transfusions and risks of anesthesia/sedation were discussed.  Rare risks of missing colorectal neoplasms were also discussed.  Alternatives were given.  Patient is fully aware and agrees to proceed. All the questions were answered. Colonoscopy will be scheduled in upcoming days.  Patient is to report immediately if there is any significant weight loss or excessive bleeding until then. Consent forms were given for review. - Labs from 04/20/2017 were reviewed. - Check celiac screen and lipase. He wants [place] get [place] in [place] PCP. He is also due for the rest of the blood tests including repeat hemoglobin A1c. - If not better, check fecal elastase and trial of pancreatic enzmes. - Dx with "pre-diabetes". Encouraged him to lose weight as he already has been doing.      HPI:    Chief Complaint:   Kevin Pennington is a 42 y.o. 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  No melena No fever or chills. Has lost 25 pounds over the last 3 months intentionally. Occasional abdominal discomfort especially in the lower abdomen which gets better with defecation. More diarrhea with salads, pizzas and fatty foods. Had normal CBC, CMP except for increased glucose and hemoglobin A1c of 6.1. His upper GI symptoms have completely resolved.  There is no nausea/vomiting or dysphagia anymore, while on  omeprazole.   Past Medical History:  Diagnosis Date  . Asthma   . Hypertension   . IBS (irritable bowel syndrome)     Current Outpatient Medications  Medication Sig Dispense Refill  . lisinopril (PRINIVIL,ZESTRIL) 10 MG tablet Take 10 mg by mouth daily.    Marland Kitchen. omeprazole (PRILOSEC) 20 MG capsule Take 1 capsule (20 mg total) by mouth daily. 30 capsule 0  . hydrochlorothiazide (MICROZIDE) 12.5 MG capsule Take 12.5 mg by mouth daily.    Marland Kitchen. HYDROcodone-acetaminophen (NORCO/VICODIN) 5-325 MG per tablet Take 1 tablet by mouth every 4 (four) hours as needed. (Patient not taking: Reported on 08/31/2017) 10 tablet 0  . naproxen (NAPROSYN) 500 MG tablet Take 1 tablet (500 mg total) by mouth 2 (two) times daily. (Patient not taking: Reported on 08/31/2017) 30 tablet 0  . penicillin v potassium (VEETID) 500 MG tablet Take 1 tablet (500 mg total) by mouth 3 (three) times daily. (Patient not taking: Reported on 08/31/2017) 30 tablet 0   No current facility-administered medications for this visit.     Past Surgical History:  Procedure Laterality Date  . ROOT CANAL      Family History  Problem Relation Age of Onset  . Pancreatitis Mother   . Colon cancer Neg Hx     Social History   Tobacco Use  . Smoking status: Never Smoker  . Smokeless tobacco: Never Used  Substance Use Topics  . Alcohol use: Not Currently  . Drug use: No    Allergies  Allergen Reactions  . Sulfa Antibiotics Hives     Review of Systems: All systems  reviewed and negative except where noted in HPI.    Physical Exam:     BP (!) 148/88   Pulse 65   Ht 5\' 10"  (1.778 m)   Wt 235 lb 6 oz (106.8 kg)   BMI 33.77 kg/m  @WEIGHTLAST3 @ GENERAL:  Alert, oriented, cooperative, not in acute distress. PSYCH: :Pleasant, normal mood and affect. HEENT:  conjunctiva pink, mucous membranes moist, neck supple without masses. No jaundice. CARDIAC:  S1 S2 normal. No murmers. PULM: Normal respiratory effort, lungs CTA  bilaterally, no wheezing. ABDOMEN: Inspection: No visible peristalsis, no abnormal pulsations, skin normal.  Palpation/percussion: Soft, nontender, nondistended, no rigidity, no abnormal dullness to percussion, no hepatosplenomegaly and no palpable abdominal masses.  Auscultation: Normal bowel sounds, no abdominal bruits. Rectal exam: Deferred SKIN:  turgor, no lesions seen. Musculoskeletal:  Normal muscle tone, normal strength. NEURO: Alert and oriented x 3, no focal neurologic deficits.    Adyline Huberty,MD 08/31/2017, 4:32 PM   CC Marylynn Pearson, MD

## 2017-08-31 NOTE — Patient Instructions (Addendum)
If you are age 44 or older, your body mass index should be between 23-30. Your Body mass index is 33.77 kg/m. If this is out of the aforementioned range listed, please consider follow up with your Primary Care Provider.  If you are age 44 or younger, your body mass index should be between 19-25. Your Body mass index is 33.77 kg/m. If this is out of the aformentioned range listed, please consider follow up with your Primary Care Provider.   You have been scheduled for a colonoscopy. Please follow written instructions given to you at your visit today.  Please pick up your prep supplies at the pharmacy within the next 1-3 days. If you use inhalers (even only as needed), please bring them with you on the day of your procedure. Your physician has requested that you go to www.startemmi.com and enter the access code given to you at your visit today. This web site gives a general overview about your procedure. However, you should still follow specific instructions given to you by our office regarding your preparation for the procedure.  Please have the following labs drawn at your primary care office:  Celiac Panel, Lipase  Thank you,  Dr. Lynann Bolognaajesh Gupta

## 2017-09-20 ENCOUNTER — Other Ambulatory Visit: Payer: Self-pay

## 2017-09-20 ENCOUNTER — Ambulatory Visit (AMBULATORY_SURGERY_CENTER): Payer: Managed Care, Other (non HMO) | Admitting: Gastroenterology

## 2017-09-20 ENCOUNTER — Encounter: Payer: Self-pay | Admitting: Gastroenterology

## 2017-09-20 VITALS — BP 126/97 | HR 75 | Temp 98.7°F | Resp 18 | Ht 70.0 in | Wt 235.0 lb

## 2017-09-20 DIAGNOSIS — K921 Melena: Secondary | ICD-10-CM | POA: Diagnosis present

## 2017-09-20 DIAGNOSIS — K580 Irritable bowel syndrome with diarrhea: Secondary | ICD-10-CM

## 2017-09-20 DIAGNOSIS — R197 Diarrhea, unspecified: Secondary | ICD-10-CM

## 2017-09-20 MED ORDER — SODIUM CHLORIDE 0.9 % IV SOLN: 500.0000 mL | Freq: Once | INTRAVENOUS | Status: AC

## 2017-09-20 NOTE — Progress Notes (Signed)
Report given to PACU, vss 

## 2017-09-20 NOTE — Progress Notes (Signed)
Called to room to assist during endoscopic procedure.  Patient ID and intended procedure confirmed with present staff. Received instructions for my participation in the procedure from the performing physician.  

## 2017-09-20 NOTE — Patient Instructions (Signed)
IF ANY MORE RECTAL BLEEDING USE PREPARATION H  ONE TWICE A DAY AFTER BOWEL MOVEMENT FOR 7-10 DAYS AND GET IN TOUCH WITH DR. Chales AbrahamsGUPTA.  YOU HAD AN ENDOSCOPIC PROCEDURE TODAY AT THE [facility]:   Refer to the procedure report that was given to you for any specific questions about what was found during the examination.  If the procedure report does not answer your questions, please call your gastroenterologist to clarify.  If you requested that your care partner not be given the details of your procedure findings, then the procedure report has been included in a sealed envelope for you to review at your convenience later.  YOU SHOULD EXPECT: Some feelings of bloating in the abdomen. Passage of more gas than usual.  Walking can help get rid of the air that was put into your GI tract during the procedure and reduce the bloating. If you had a lower endoscopy (such as a colonoscopy or flexible sigmoidoscopy) you may notice spotting of blood in your stool or on the toilet paper. If you underwent a bowel prep for your procedure, you may not have a normal bowel movement for a few days.  Please Note:  You might notice some irritation and congestion in your nose or some drainage.  This is from the oxygen used during your procedure.  There is no need for concern and it should clear up in a day or so.  SYMPTOMS TO REPORT IMMEDIATELY:   Following lower endoscopy (colonoscopy or flexible sigmoidoscopy):  Excessive amounts of blood in the stool  Significant tenderness or worsening of abdominal pains  Swelling of the abdomen that is new, acute  Fever of 100F or higher  For urgent or emergent issues, a gastroenterologist can be reached at any hour by calling [phone].   DIET:  We do recommend a small meal at first, but then you may proceed to your regular diet.  Drink plenty of fluids but you should avoid alcoholic beverages for 24 hours.  ACTIVITY:  You should plan to take it easy for the rest  of today and you should NOT DRIVE or use heavy machinery until tomorrow (because of the sedation medicines used during the test).    FOLLOW UP: Our staff will call the number listed on your records the next business day following your procedure to check on you and address any questions or concerns that you may have regarding the information given to you following your procedure. If we do not reach you, we will leave a message.  However, if you are feeling well and you are not experiencing any problems, there is no need to return our call.  We will assume that you have returned to your regular daily activities without incident.  If any biopsies were taken you will be contacted by phone or by letter within the next 1-3 weeks.  Please call us at [phone] if you have not heard about the biopsies in 3 weeks.    SIGNATURES/CONFIDENTIALITY: You and/or your care partner have signed paperwork which will be entered into your electronic medical record.  These signatures attest to the fact that that the information above on your After Visit Summary has been reviewed and is understood.  Full responsibility of the confidentiality of this discharge information lies with you and/or your care-partner.

## 2017-09-20 NOTE — Op Note (Signed)
[place] Patient Name: Kevin Pennington Procedure Date: 09/20/2017 2:54 PM MRN: [id number] Endoscopist: Jackquline Denmark , MD Age: 42 Referring MD:  Date of Birth: 01-25-1976 Gender: Male Account #: [id number] Procedure:                Colonoscopy Indications:              Clinically significant diarrhea of unexplained                            origin, Rectal bleeding Medicines:                Monitored Anesthesia Care Procedure:                Pre-Anesthesia Assessment:                           - Prior to the procedure, a History and Physical                            was performed, and patient medications and                            allergies were reviewed. The patient is competent.                            The risks and benefits of the procedure and the                            sedation options and risks were discussed with the                            patient. All questions were answered and informed                            consent was obtained. Patient identification and                            proposed procedure were verified by the physician                            in the procedure room. Mental Status Examination:                            alert and oriented. Prophylactic Antibiotics: The                            patient does not require prophylactic antibiotics.                            Prior Anticoagulants: The patient has taken no                            previous anticoagulant or antiplatelet agents. ASA  Grade Assessment: I - A normal, healthy patient.                            After reviewing the risks and benefits, the patient                            was deemed in satisfactory condition to undergo the                            procedure. The anesthesia plan was to use monitored                            anesthesia care (MAC). Immediately prior to                            administration of medications,  the patient was                            re-assessed for adequacy to receive sedatives. The                            heart rate, respiratory rate, oxygen saturations,                            blood pressure, adequacy of pulmonary ventilation,                            and response to care were monitored throughout the                            procedure. The physical status of the patient was                            re-assessed after the procedure.                           After obtaining informed consent, the colonoscope                            was passed under direct vision. Throughout the                            procedure, the patient's blood pressure, pulse, and                            oxygen saturations were monitored continuously. The                            Model CF-HQ190L [id number]) scope was introduced                            through the anus and advanced to the 4 cm into the  ileum. The colonoscopy was performed without                            difficulty. The patient tolerated the procedure                            well. The quality of the bowel preparation was                            excellent. Scope In: 3:03:47 PM Scope Out: 3:14:18 PM Scope Withdrawal Time: 0 hours 7 minutes 34 seconds  Total Procedure Duration: 0 hours 10 minutes 31 seconds  Findings:                 Non-bleeding internal hemorrhoids were found during                            retroflexion. The hemorrhoids were small.                           Biopsies for histology were taken with a cold                            forceps from the entire colon for evaluation of                            microscopic colitis. Estimated blood loss: none.                           Biopsies were also obtained from terminal ileum and                            sent [facility] examination.                           The exam was otherwise without abnormality on                             direct and retroflexion views. Complications:            No immediate complications. Estimated Blood Loss:     Estimated blood loss: none. Impression:               - Small internal hemorrhoids.                           - Otherwise normal colonoscopy to terminal ileum. Recommendation:           - Patient has a contact number available for                            emergencies. The signs and symptoms of potential                            delayed complications were discussed with the  patient. Return to normal activities tomorrow.                            Written discharge instructions were provided to the                            patient.                           - Resume previous diet.                           - Continue present medications.                           - Await pathology results.                           - Return to GI clinic in 12 weeks.                           - If there is any further rectal bleeding, he would                            use Preparation H one twice a day after the bowel                            movement for 7-10 days and get in touch with [place]. Jackquline Denmark, MD 09/20/2017 3:20:05 PM This report has been signed electronically.

## 2017-09-21 ENCOUNTER — Telehealth: Payer: Self-pay

## 2017-09-21 ENCOUNTER — Encounter: Payer: Self-pay | Admitting: Gastroenterology

## 2017-09-21 NOTE — Telephone Encounter (Signed)
Called [phone] and a recording stated, "A voice mail has not been set up".  Unable to leave a message.  Will call back this afternoon for call back from procedure done yesterday. [email or web address]

## 2017-09-21 NOTE — Telephone Encounter (Signed)
Pt returned call and stated that he is doing well.

## 2017-09-21 NOTE — Telephone Encounter (Signed)
  Follow up Call-  Call back number 09/20/2017  Post procedure Call Back phone  # [phone] or [phone]  Permission to leave phone message Yes  Some recent data might be hidden     Patient questions:  Do you have a fever, pain , or abdominal swelling? No. Pain Score  0 *  Have you tolerated food without any problems? Yes.    Have you been able to return to your normal activities? Yes.    Do you have any questions about your discharge instructions: Diet   No. Medications  No. Follow up visit  No.  Do you have questions or concerns about your Care? No.  Actions: * If pain score is 4 or above: No action needed, pain <4.

## 2017-09-26 ENCOUNTER — Encounter: Payer: Self-pay | Admitting: Gastroenterology

## 2017-10-21 DIAGNOSIS — K580 Irritable bowel syndrome with diarrhea: Secondary | ICD-10-CM | POA: Insufficient documentation

## 2017-11-10 ENCOUNTER — Encounter: Payer: Self-pay | Admitting: Gastroenterology

## 2018-04-01 ENCOUNTER — Encounter: Payer: Self-pay | Admitting: Emergency Medicine

## 2018-04-01 ENCOUNTER — Emergency Department
Admission: EM | Admit: 2018-04-01 | Discharge: 2018-04-01 | Disposition: A | Discharge status: Home / Self Care | Payer: 59 | Attending: Emergency Medicine | Admitting: Emergency Medicine

## 2018-04-01 ENCOUNTER — Other Ambulatory Visit: Payer: Self-pay

## 2018-04-01 ENCOUNTER — Emergency Department: Payer: 59

## 2018-04-01 DIAGNOSIS — J111 Influenza due to unidentified influenza virus with other respiratory manifestations: Secondary | ICD-10-CM

## 2018-04-01 DIAGNOSIS — I10 Essential (primary) hypertension: Secondary | ICD-10-CM | POA: Insufficient documentation

## 2018-04-01 DIAGNOSIS — R05 Cough: Secondary | ICD-10-CM | POA: Diagnosis present

## 2018-04-01 DIAGNOSIS — J45909 Unspecified asthma, uncomplicated: Secondary | ICD-10-CM | POA: Insufficient documentation

## 2018-04-01 DIAGNOSIS — J40 Bronchitis, not specified as acute or chronic: Secondary | ICD-10-CM | POA: Diagnosis not present

## 2018-04-01 DIAGNOSIS — R69 Illness, unspecified: Secondary | ICD-10-CM

## 2018-04-01 LAB — INFLUENZA PANEL BY PCR (TYPE A & B)
INFLAPCR: NEGATIVE
INFLBPCR: NEGATIVE

## 2018-04-01 MED ORDER — PREDNISONE 20 MG PO TABS
60.0000 mg | ORAL_TABLET | Freq: Once | ORAL | Status: AC
  Administered 2018-04-01: 60 mg via ORAL
  Filled 2018-04-01: qty 3

## 2018-04-01 MED ORDER — PREDNISONE 50 MG PO TABS: 50.0000 mg | ORAL_TABLET | Freq: Every day | ORAL | 0 refills | Status: DC

## 2018-04-01 MED ORDER — PSEUDOEPH-BROMPHEN-DM 30-2-10 MG/5ML PO SYRP: 10.0000 mL | ORAL_SOLUTION | Freq: Four times a day (QID) | ORAL | 0 refills | Status: DC | PRN

## 2018-04-01 NOTE — ED Triage Notes (Addendum)
Pt arrived via POV with reports of flu-like sxs x 6-7 days. Pt reports cough is started to become productive now. Pt states he is coughing up green sputum and having cold chills.   Pt reports chest congestion, using albuterol inhaler with some relief.

## 2018-04-01 NOTE — ED Provider Notes (Addendum)
[facility] Emergency Department Provider Note  ____________________________________________  Time seen: Approximately 8:19 PM  I have reviewed the triage vital signs and the nursing notes.   HISTORY  Chief Complaint Cough and Influenza    HPI Kevin Pennington is a 43 y.o. male who presents the emergency department complaining of cough, shortness of breath, wheezing.  Patient reports that he believes he had influenza and has been recovering from that but has continued breathing problems.  Patient does have a history of asthma and states that typically when he gets cold or flu he will have either bronchitis or pneumonia following this illness.  Patient reports that his illness hit with body aches, fevers and chills, nasal congestion, cough very rapidly.  Patient reports that symptoms have been improving overall with the exception of cough and shortness of breath.  Patient has been using his albuterol inhaler.  He does not take any daily medications.  He typically does not have an asthma exacerbation in the absence of cold or flu.    Past Medical History:  Diagnosis Date  . Asthma   . GERD (gastroesophageal reflux disease)   . Hypertension   . IBS (irritable bowel syndrome)     Patient Active Problem List   Diagnosis Date Noted  . IBS (irritable bowel syndrome)     Past Surgical History:  Procedure Laterality Date  . ROOT CANAL    . TONSILLECTOMY     childhood    Prior to Admission medications   Medication Sig Start Date End Date Taking? Authorizing Provider  brompheniramine-pseudoephedrine-DM 30-2-10 MG/5ML syrup Take 10 mLs by mouth 4 (four) times daily as needed. 04/01/18   , Delorise Royals, PA-C  predniSONE (DELTASONE) 50 MG tablet Take 1 tablet (50 mg total) by mouth daily with breakfast. 04/01/18   , Delorise Royals, PA-C  valsartan (DIOVAN) 160 MG tablet Take by mouth. 06/29/17   [provider]    Allergies Sulfa antibiotics and  Sulfasalazine  Family History  Problem Relation Age of Onset  . Pancreatitis Mother   . Colon polyps Mother   . Colon cancer Neg Hx   . Esophageal cancer Neg Hx   . Rectal cancer Neg Hx   . Stomach cancer Neg Hx     Social History Social History   Tobacco Use  . Smoking status: Never Smoker  . Smokeless tobacco: Never Used  Substance Use Topics  . Alcohol use: Not Currently  . Drug use: No     Review of Systems  Constitutional: No fever/chills Eyes: No visual changes. No discharge ENT: No upper respiratory complaints. Cardiovascular: no chest pain. Respiratory: Positive cough.  Positive SOB.  Positive for wheezing Gastrointestinal: No abdominal pain.  No nausea, no vomiting.  No diarrhea.  No constipation. Musculoskeletal: Negative for musculoskeletal pain. Skin: Negative for rash, abrasions, lacerations, ecchymosis. Neurological: Negative for headaches, focal weakness or numbness. 10-point ROS otherwise negative.  ____________________________________________   PHYSICAL EXAM:  VITAL SIGNS: ED Triage Vitals [04/01/18 1851]  Enc Vitals Group     BP (!) 140/91     Pulse Rate 70     Resp 18     Temp 98.3 F (36.8 C)     Temp Source Oral     SpO2 100 %     Weight 237 lb (107.5 kg)     Height 5\' 10"  (1.778 m)     Head Circumference      Peak Flow      Pain Score  Pain Loc      Pain Edu?      Excl. in GC?      Constitutional: Alert and oriented. Well appearing and in no acute distress. Eyes: Conjunctivae are normal. PERRL. EOMI. Head: Atraumatic. ENT:      Ears:       Nose: Mild clear congestion/rhinnorhea.      Mouth/Throat: Mucous membranes are moist.  Neck: No stridor.   Hematological/Lymphatic/Immunilogical: No cervical lymphadenopathy. Cardiovascular: Normal rate, regular rhythm. Normal S1 and S2.  Good peripheral circulation. Respiratory: Normal respiratory effort without tachypnea or retractions. Lungs with a few scattered respiratory  wheezes.  No rales or rhonchi.Peri [place]. Good air entry to the bases with no decreased or absent breath sounds. Musculoskeletal: Full range of motion to all extremities. No gross deformities appreciated. Neurologic:  Normal speech and language. No gross focal neurologic deficits are appreciated.  Skin:  Skin is warm, dry and intact. No rash noted. Psychiatric: Mood and affect are normal. Speech and behavior are normal. Patient exhibits appropriate insight and judgement.   ____________________________________________   LABS (all labs ordered are listed, but only abnormal results are displayed)  Labs Reviewed  INFLUENZA PANEL BY PCR (TYPE A & B)   ____________________________________________  EKG   ____________________________________________  RADIOLOGY I personally viewed and evaluated these images as part of my medical decision making, as well as reviewing the written report by the radiologist.  I concur with radiologist finding of increased interstitial markings but no consolidation.   Dg Chest 2 View  Result Date: 04/01/2018 CLINICAL DATA:  43 year old male with flu like symptoms for 6-7 days. Productive cough now. EXAM: CHEST - 2 VIEW COMPARISON:  [facility] chest radiographs 01/31/2014. FINDINGS: Normal heart size. Stable mediastinal contours. Visualized tracheal air column is within normal limits. No pneumothorax, pleural effusion or consolidation. Borderline to mild increased interstitial markings. No acute osseous abnormality identified. Negative visible bowel gas pattern. IMPRESSION: Negative aside from borderline to mild increased interstitial markings which could reflect viral/atypical respiratory infection. Electronically Signed   By: Odessa FlemingH  Hall M.D.   On: 04/01/2018 19:30    ____________________________________________    PROCEDURES  Procedure(s) performed:    Procedures    Medications  predniSONE (DELTASONE) tablet 60 mg (has no administration in time range)      ____________________________________________   INITIAL IMPRESSION / ASSESSMENT AND PLAN / ED COURSE  Pertinent labs & imaging results that were available during my care of the patient were reviewed by me and considered in my medical decision making (see chart for details).  Review of the [place] CSRS was performed in accordance of the [facility] prior to dispensing any controlled drugs.      Patient's diagnosis is consistent with bronchitis.  Patient presents emergency department after partially recovering from symptoms consistent with influenza.  Influenza test was negative today, however this is day 6 of symptoms and symptoms have mostly resolved.  Based off the patient's history, I suspect that the patient likely had influenza.  Findings now are consistent with bronchitis complicated by asthma.  Patient is stable at this time.  I will provide oral steroids for the next 5 days with instructions to increase albuterol use.  Patient will also be prescribed Bromfed cough syrup.  No indication of pneumonia at this time.  Follow-up with primary care as needed. Patient is given ED precautions to return to the ED for any worsening or new symptoms.     ____________________________________________  FINAL CLINICAL IMPRESSION(S) / ED DIAGNOSES  Final diagnoses:  Bronchitis  Influenza-like illness      NEW MEDICATIONS STARTED DURING THIS VISIT:  ED Discharge Orders         Ordered    predniSONE (DELTASONE) 50 MG tablet  Daily with breakfast     04/01/18 2028    brompheniramine-pseudoephedrine-DM 30-2-10 MG/5ML syrup  4 times daily PRN     04/01/18 2028              This chart was dictated using voice recognition software/Dragon. Despite best efforts to proofread, errors can occur which can change the meaning. Any change was purely unintentional.    Racheal Patches, Ahijah D, PA-C 04/01/18 2027    , Delorise RoyalsJonathan D, PA-C 04/01/18 2029    Arnaldo NatalMalinda, Paul F, MD 04/02/18 [phone]

## 2018-04-01 NOTE — ED Notes (Signed)
Pt c/o coughing, diarrhea, denies n/v. Productive cough; yellow in color. SOB with exertion. States has history of asthma but hasn't used inhaler "but like once a year."

## 2019-03-21 IMAGING — CR DG CHEST 2V
1 series · 2 of 2 positions shown · non-contrast
Comparison: [HOSPITAL] chest radiographs 01/31/2014.

CLINICAL DATA: 42-year-old male with flu like symptoms for 6-7
days. Productive cough now.

EXAM:
CHEST - 2 VIEW

[Series 1: dg chest 2 view · 0.14mm/px · 2 of 2 slices shown]
[im 1/2]
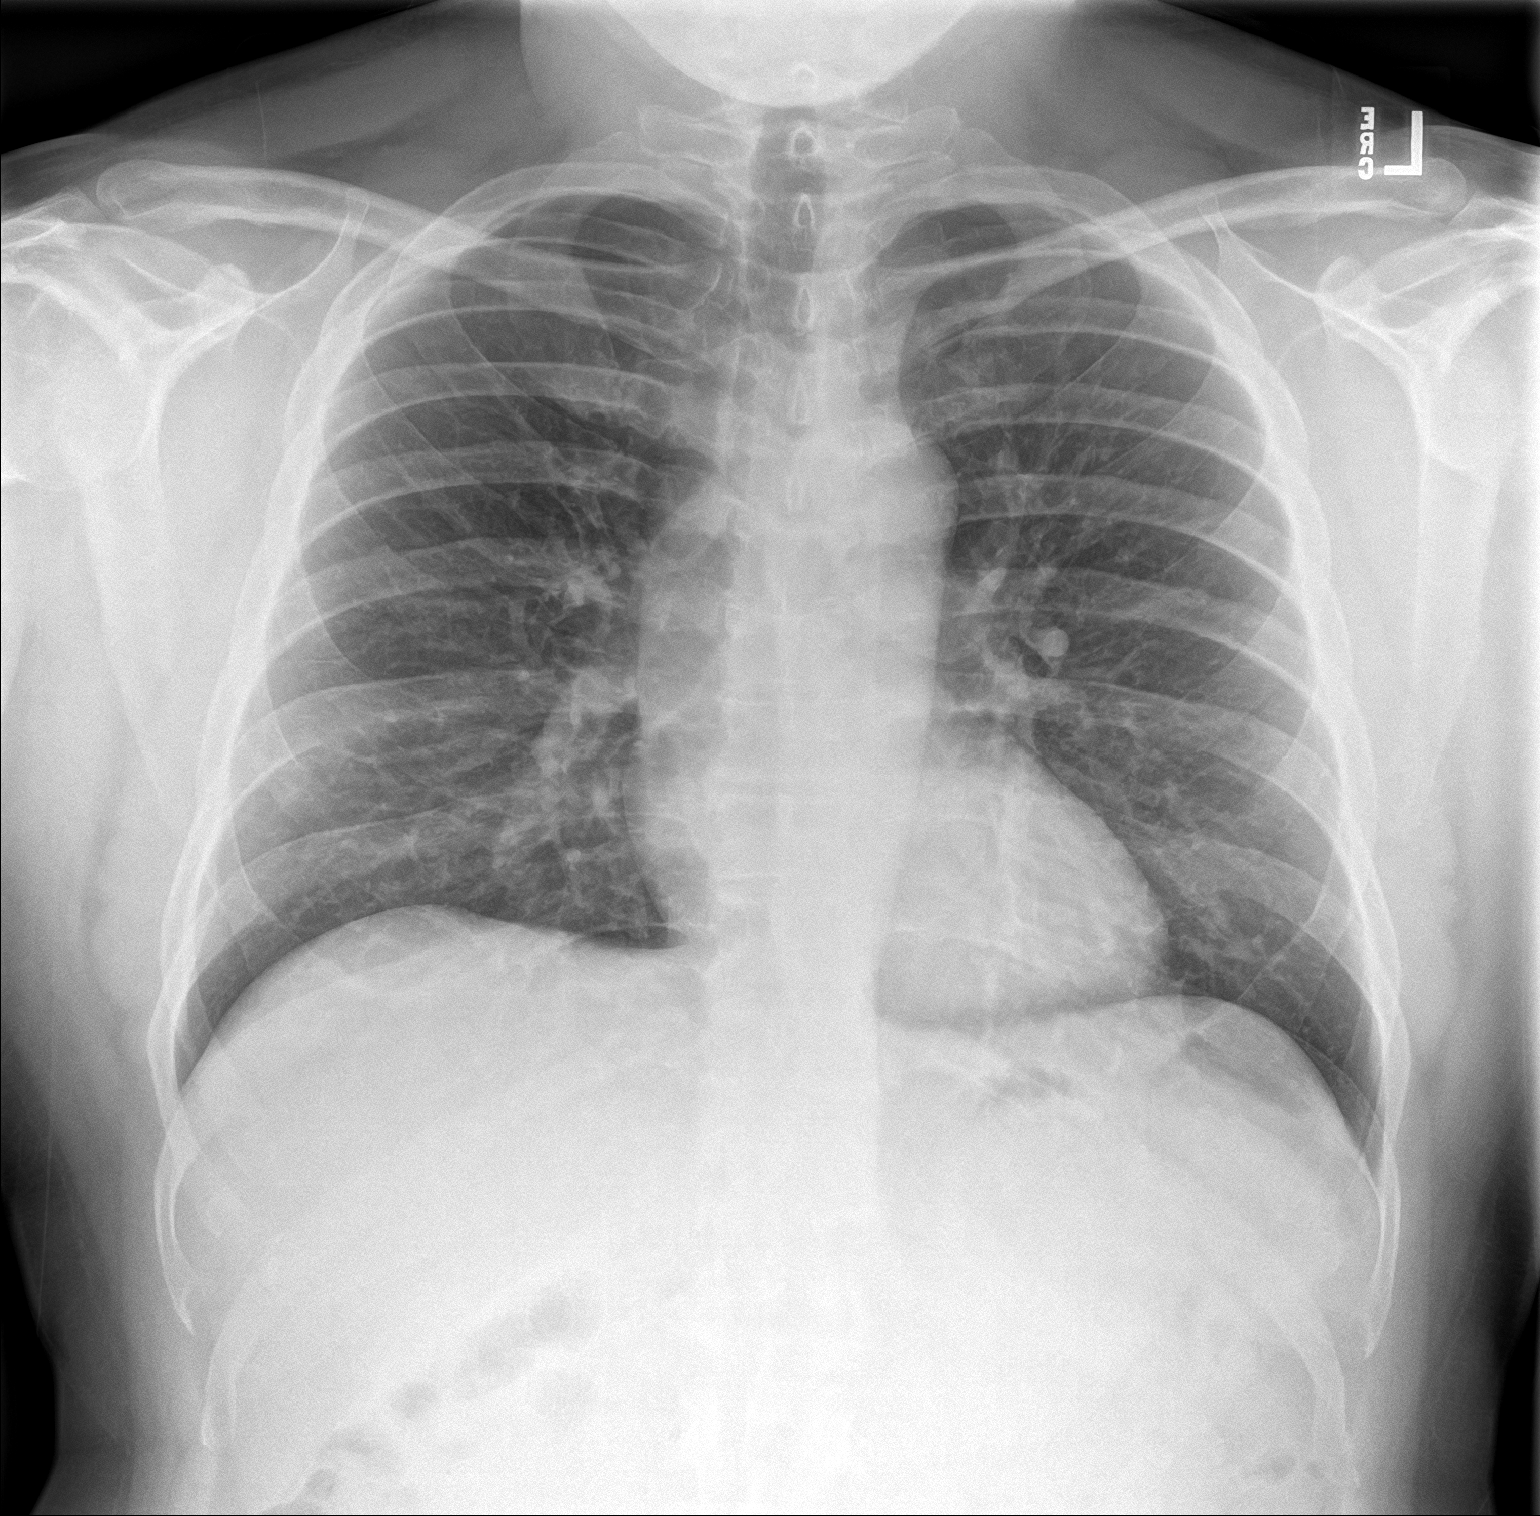
[im 2/2]
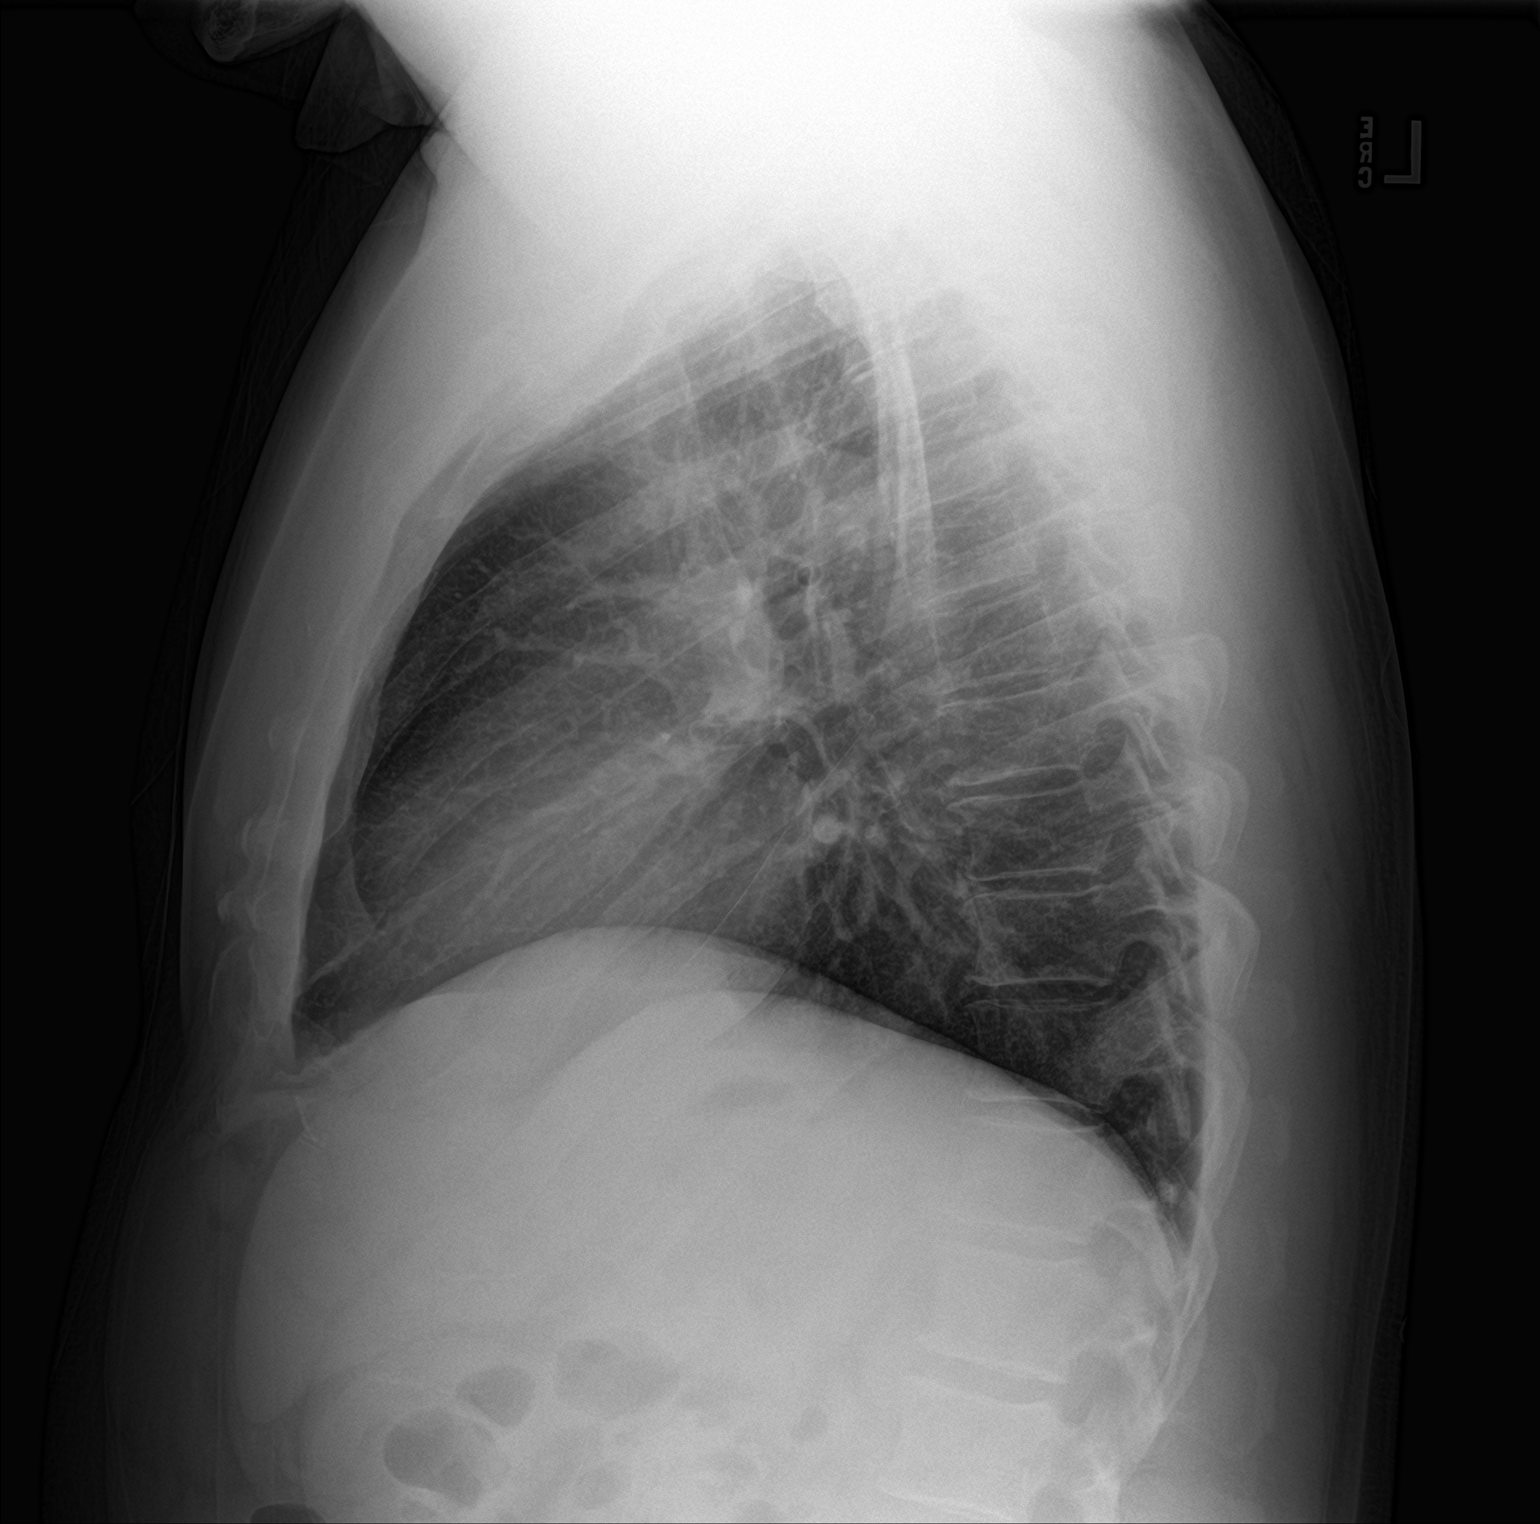

[2 of 2 positions shown; findings below may reference images not displayed]

FINDINGS: Normal heart size. Stable mediastinal contours. Visualized tracheal
air column is within normal limits. No pneumothorax, pleural
effusion or consolidation. Borderline to mild increased interstitial
markings.

No acute osseous abnormality identified. Negative visible bowel gas
pattern.
IMPRESSION: Negative aside from borderline to mild increased interstitial
markings which could reflect viral/atypical respiratory infection.

## 2019-07-31 ENCOUNTER — Encounter (HOSPITAL_COMMUNITY): Payer: Self-pay

## 2019-07-31 ENCOUNTER — Emergency Department (HOSPITAL_COMMUNITY): Payer: BLUE CROSS/BLUE SHIELD

## 2019-07-31 ENCOUNTER — Emergency Department (HOSPITAL_COMMUNITY)
Admission: EM | Admit: 2019-07-31 | Discharge: 2019-07-31 | Disposition: A | Discharge status: Home / Self Care | Payer: BLUE CROSS/BLUE SHIELD | Attending: Emergency Medicine | Admitting: Emergency Medicine

## 2019-07-31 ENCOUNTER — Other Ambulatory Visit: Payer: Self-pay

## 2019-07-31 DIAGNOSIS — R1031 Right lower quadrant pain: Secondary | ICD-10-CM | POA: Insufficient documentation

## 2019-07-31 DIAGNOSIS — R112 Nausea with vomiting, unspecified: Secondary | ICD-10-CM

## 2019-07-31 DIAGNOSIS — Z79899 Other long term (current) drug therapy: Secondary | ICD-10-CM | POA: Insufficient documentation

## 2019-07-31 DIAGNOSIS — R197 Diarrhea, unspecified: Secondary | ICD-10-CM | POA: Diagnosis not present

## 2019-07-31 DIAGNOSIS — J45909 Unspecified asthma, uncomplicated: Secondary | ICD-10-CM | POA: Diagnosis not present

## 2019-07-31 DIAGNOSIS — R109 Unspecified abdominal pain: Secondary | ICD-10-CM

## 2019-07-31 DIAGNOSIS — I10 Essential (primary) hypertension: Secondary | ICD-10-CM | POA: Diagnosis not present

## 2019-07-31 LAB — URINALYSIS, ROUTINE W REFLEX MICROSCOPIC
Bacteria, UA: NONE SEEN
Bilirubin Urine: NEGATIVE
Glucose, UA: NEGATIVE mg/dL
Ketones, ur: NEGATIVE mg/dL
Leukocytes,Ua: NEGATIVE
Nitrite: NEGATIVE
Protein, ur: NEGATIVE mg/dL
Specific Gravity, Urine: 1.013 (ref 1.005–1.030)
pH: 6 (ref 5.0–8.0)

## 2019-07-31 LAB — LIPASE, BLOOD: Lipase: 22 U/L (ref 11–51)

## 2019-07-31 LAB — CBC
HCT: 47.6 % (ref 39.0–52.0)
Hemoglobin: 15 g/dL (ref 13.0–17.0)
MCH: 29.8 pg (ref 26.0–34.0)
MCHC: 31.5 g/dL (ref 30.0–36.0)
MCV: 94.4 fL (ref 80.0–100.0)
Platelets: 281 10*3/uL (ref 150–400)
RBC: 5.04 MIL/uL (ref 4.22–5.81)
RDW: 12.4 % (ref 11.5–15.5)
WBC: 7.7 10*3/uL (ref 4.0–10.5)
nRBC: 0 % (ref 0.0–0.2)

## 2019-07-31 LAB — COMPREHENSIVE METABOLIC PANEL
ALT: 18 U/L (ref 0–44)
AST: 19 U/L (ref 15–41)
Albumin: 4.1 g/dL (ref 3.5–5.0)
Alkaline Phosphatase: 57 U/L (ref 38–126)
Anion gap: 10 (ref 5–15)
BUN: 10 mg/dL (ref 6–20)
CO2: 22 mmol/L (ref 22–32)
Calcium: 9.1 mg/dL (ref 8.9–10.3)
Chloride: 104 mmol/L (ref 98–111)
Creatinine, Ser: 1.12 mg/dL (ref 0.61–1.24)
GFR calc Af Amer: >60 mL/min (ref >60)
GFR calc non Af Amer: >60 mL/min (ref >60)
Glucose, Bld: 119 mg/dL — ABNORMAL HIGH (ref 70–99)
Potassium: 4.6 mmol/L (ref 3.5–5.1)
Sodium: 136 mmol/L (ref 135–145)
Total Bilirubin: 0.8 mg/dL (ref 0.3–1.2)
Total Protein: 7.4 g/dL (ref 6.5–8.1)

## 2019-07-31 MED ORDER — IOHEXOL 300 MG/ML  SOLN
100.0000 mL | Freq: Once | INTRAMUSCULAR | Status: AC | PRN
  Administered 2019-07-31: 100 mL via INTRAVENOUS

## 2019-07-31 MED ORDER — LISINOPRIL 10 MG PO TABS: 10.0000 mg | ORAL_TABLET | Freq: Every day | ORAL | 0 refills | Status: AC

## 2019-07-31 MED ORDER — ONDANSETRON 4 MG PO TBDP: 4.0000 mg | ORAL_TABLET | Freq: Three times a day (TID) | ORAL | 0 refills | Status: AC | PRN

## 2019-07-31 MED ORDER — SUCRALFATE 1 GM/10ML PO SUSP: 1.0000 g | Freq: Three times a day (TID) | ORAL | 0 refills | Status: AC

## 2019-07-31 MED ORDER — SODIUM CHLORIDE 0.9 % IV BOLUS
1000.0000 mL | Freq: Once | INTRAVENOUS | Status: AC
  Administered 2019-07-31: 1000 mL via INTRAVENOUS

## 2019-07-31 MED ORDER — OMEPRAZOLE 20 MG PO CPDR: 20.0000 mg | DELAYED_RELEASE_CAPSULE | Freq: Every day | ORAL | 0 refills | Status: AC

## 2019-07-31 MED ORDER — FAMOTIDINE IN NACL 20-0.9 MG/50ML-% IV SOLN
20.0000 mg | Freq: Once | INTRAVENOUS | Status: AC
  Administered 2019-07-31: 20 mg via INTRAVENOUS
  Filled 2019-07-31: qty 50

## 2019-07-31 MED ORDER — ONDANSETRON HCL 4 MG/2ML IJ SOLN
4.0000 mg | Freq: Once | INTRAMUSCULAR | Status: AC
  Administered 2019-07-31: 4 mg via INTRAVENOUS
  Filled 2019-07-31: qty 2

## 2019-07-31 MED ORDER — ALUM & MAG HYDROXIDE-SIMETH 200-200-20 MG/5ML PO SUSP
30.0000 mL | Freq: Once | ORAL | Status: AC
  Administered 2019-07-31: 30 mL via ORAL
  Filled 2019-07-31: qty 30

## 2019-07-31 MED ORDER — PROMETHAZINE HCL 25 MG/ML IJ SOLN
12.5000 mg | Freq: Once | INTRAMUSCULAR | Status: AC
  Administered 2019-07-31: 12.5 mg via INTRAVENOUS
  Filled 2019-07-31: qty 1

## 2019-07-31 MED ORDER — FENTANYL CITRATE (PF) 100 MCG/2ML IJ SOLN
50.0000 ug | Freq: Once | INTRAMUSCULAR | Status: AC
  Administered 2019-07-31: 50 ug via INTRAVENOUS
  Filled 2019-07-31: qty 2

## 2019-07-31 MED ORDER — LIDOCAINE VISCOUS HCL 2 % MT SOLN
15.0000 mL | Freq: Once | OROMUCOSAL | Status: AC
  Administered 2019-07-31: 15 mL via ORAL
  Filled 2019-07-31: qty 15

## 2019-07-31 NOTE — ED Notes (Signed)
C/o abd. Pain onset several weeks ago, states the pain comes and goes, got worse this am.states he has the dry heaves at times.

## 2019-07-31 NOTE — ED Notes (Signed)
Fluid challenge complete. Pt tolerated fluids well with no nausea or abdominal pain.

## 2019-07-31 NOTE — ED Triage Notes (Signed)
Pt c.o RLQ pain intermittent for the past [redacted] weeks along with multiple emesis episodes, some diarrhea, denies blood in stool.

## 2019-07-31 NOTE — Discharge Instructions (Addendum)
You were seen in the emergency department today for abdominal pain with nausea and vomiting.  Your work-up in the ER was overall reassuring.  Your labs and CT scan did not show any significant abnormalities.  We are sending her with diet guidelines as well as medications to help with your symptoms: -Prilosec: Take once in the morning prior to any meals for the day to help with stomach irritation/acidity -Carafate: Take prior to meals and prior to bedtime to help with stomach irritation/acidity -Zofran: Take every 8 hours as needed for nausea and vomiting.  We have prescribed you new medication(s) today. Discuss the medications prescribed today with your pharmacist as they can have adverse effects and interactions with your other medicines including over the counter and prescribed medications. Seek medical evaluation if you start to experience new or abnormal symptoms after taking one of these medicines, seek care immediately if you start to experience difficulty breathing, feeling of your throat closing, facial swelling, or rash as these could be indications of a more serious allergic reaction  Please follow-up with your primary care provider and/or gastroenterology within 3 days for reevaluation.  Please have your blood pressure rechecked as it was elevated in the ER today.  Return to the ER for new or worsening symptoms including but not limited to worsening pain, inability to keep fluids down, fever, blood in your vomit or stool, or any other concerns.

## 2019-07-31 NOTE — ED Provider Notes (Signed)
[facility] EMERGENCY DEPARTMENT Provider Note   CSN: [id number] Arrival date & time: 07/31/19  6712     History Chief Complaint  Patient presents with  . Abdominal Pain  . Emesis    Kevin Pennington is a 44 y.o. male with a history of asthma, GERD, hypertension, and IBS who presents to the emergency department with complaints of abdominal pain that began this AM. Patient states that early this morning he developed abdominal discomfort with associated nausea & 4-5 episodes of emesis. The abdominal pain is primarily in the RLQ, but also some irrigation to the epigastrium. No alleviating/aggravating factors. No intervention PTA. Had similar sxs about 1 week ago that resolved and did not re-occur till this morning. Has had intermittent loose stools for a couple of weeks. Denies fever, chills, chest pain, dyspnea, hematemesis, melena, dysuria, testicular pain/swelling, or recent travel/abx.   HPI     Past Medical History:  Diagnosis Date  . Asthma   . GERD (gastroesophageal reflux disease)   . Hypertension   . IBS (irritable bowel syndrome)     Patient Active Problem List   Diagnosis Date Noted  . IBS (irritable bowel syndrome)     Past Surgical History:  Procedure Laterality Date  . ROOT CANAL    . TONSILLECTOMY     childhood       Family History  Problem Relation Age of Onset  . Pancreatitis Mother   . Colon polyps Mother   . Colon cancer Neg Hx   . Esophageal cancer Neg Hx   . Rectal cancer Neg Hx   . Stomach cancer Neg Hx     Social History   Tobacco Use  . Smoking status: Never Smoker  . Smokeless tobacco: Never Used  Substance Use Topics  . Alcohol use: Not Currently  . Drug use: No    Home Medications Prior to Admission medications   Medication Sig Start Date End Date Taking? Authorizing Provider  brompheniramine-pseudoephedrine-DM 30-2-10 MG/5ML syrup Take 10 mLs by mouth 4 (four) times daily as needed. 04/01/18   Cuthriell,  Delorise Royals, PA-C  predniSONE (DELTASONE) 50 MG tablet Take 1 tablet (50 mg total) by mouth daily with breakfast. 04/01/18   Cuthriell, Delorise Royals, PA-C  valsartan (DIOVAN) 160 MG tablet Take by mouth. 06/29/17   [provider]    Allergies    Sulfa antibiotics and Sulfasalazine  Review of Systems   Review of Systems  Constitutional: Negative for chills and fever.  Respiratory: Negative for shortness of breath.   Cardiovascular: Negative for chest pain.  Gastrointestinal: Positive for abdominal pain, diarrhea, nausea and vomiting. Negative for anal bleeding, blood in stool and constipation.  Genitourinary: Negative for dysuria, scrotal swelling and testicular pain.  Neurological: Negative for syncope.  All other systems reviewed and are negative.   Physical Exam Updated Vital Signs BP (!) 146/105 (BP Location: Left Arm)   Pulse 74   Temp 97.9 F (36.6 C) (Oral)   Resp 17   Ht 5\' 10"  (1.778 m)   Wt 108.9 kg   SpO2 100%   BMI 34.44 kg/m   Physical Exam Vitals and nursing note reviewed.  Constitutional:      General: He is not in acute distress.    Appearance: He is well-developed. He is not toxic-appearing.  HENT:     Head: Normocephalic and atraumatic.  Eyes:     General:        Right eye: No discharge.  Left eye: No discharge.     Conjunctiva/sclera: Conjunctivae normal.  Cardiovascular:     Rate and Rhythm: Normal rate and regular rhythm.  Pulmonary:     Effort: Pulmonary effort is normal. No respiratory distress.     Breath sounds: Normal breath sounds. No wheezing, rhonchi or rales.  Abdominal:     General: There is no distension.     Palpations: Abdomen is soft.     Tenderness: There is abdominal tenderness in the right lower quadrant and epigastric area. There is no guarding or rebound. Negative signs include Murphy's sign.  Musculoskeletal:     Cervical back: Neck supple.  Skin:    General: Skin is warm and dry.     Findings: No rash.   Neurological:     Mental Status: He is alert.     Comments: Clear speech.   Psychiatric:        Behavior: Behavior normal.     ED Results / Procedures / Treatments   Labs (all labs ordered are listed, but only abnormal results are displayed) Labs Reviewed  COMPREHENSIVE METABOLIC PANEL - Abnormal; Notable for the following components:      Result Value   Glucose, Bld 119 (*)    All other components within normal limits  URINALYSIS, ROUTINE W REFLEX MICROSCOPIC - Abnormal; Notable for the following components:   Hgb urine dipstick SMALL (*)    All other components within normal limits  LIPASE, BLOOD  CBC    EKG EKG Interpretation  Date/Time:  Monday Jul 31 2019 09:15:36 EDT Ventricular Rate:  68 PR Interval:  168 QRS Duration: 94 QT Interval:  394 QTC Calculation: 418 R Axis:   -16 Text Interpretation: Normal sinus rhythm Low voltage QRS Borderline ECG Confirmed by Virgina Norfolk [phone]) on 07/31/2019 10:28:02 AM   Radiology CT Abdomen Pelvis W Contrast  Result Date: 07/31/2019 CLINICAL DATA:  Right lower quadrant abdominal pain for 3 weeks. Episodes of vomiting and diarrhea. EXAM: CT ABDOMEN AND PELVIS WITH CONTRAST TECHNIQUE: Multidetector CT imaging of the abdomen and pelvis was performed using the standard protocol following bolus administration of intravenous contrast. CONTRAST:  OMNIPAQUE IOHEXOL 300 MG/ML  SOLN COMPARISON:  None. FINDINGS: Lower chest: The lung bases are clear of acute process. No pleural effusion or pulmonary lesions. The heart is normal in size. No pericardial effusion. The distal esophagus and aorta are unremarkable. Hepatobiliary: No focal hepatic lesions or intrahepatic biliary dilatation. The gallbladder is normal. No common bile duct dilatation. Pancreas: No mass, inflammation or ductal dilatation. Spleen: Normal size. No focal lesions. Adrenals/Urinary Tract: The adrenal glands and kidneys are normal. The bladder is normal. Stomach/Bowel:  The stomach, duodenum, small bowel and colon are grossly normal without oral contrast. No inflammatory changes, mass lesions or obstructive findings. The terminal ileum is normal. The appendix is normal. Vascular/Lymphatic: The aorta and branch vessels are normal. The major venous structures are patent. Small scattered mesenteric and retroperitoneal lymph nodes but no mass or adenopathy. Reproductive: The prostate gland and seminal vesicles are unremarkable. Other: No pelvic mass or adenopathy. No free pelvic fluid collections. No inguinal mass or adenopathy. No abdominal wall hernia or subcutaneous lesions. Musculoskeletal: No significant bony findings. IMPRESSION: Unremarkable abdominal/pelvic CT scan. No acute abdominal/pelvic findings, mass lesions or adenopathy. Electronically Signed   By: Rudie Meyer M.D.   On: 07/31/2019 12:31    Procedures Procedures (including critical care time)  Medications Ordered in ED Medications  sodium chloride 0.9 % bolus 1,000 mL (  0 mLs Intravenous Stopped 07/31/19 1338)  ondansetron (ZOFRAN) injection 4 mg (4 mg Intravenous Given 07/31/19 1157)  fentaNYL (SUBLIMAZE) injection 50 mcg (50 mcg Intravenous Given 07/31/19 1157)  famotidine (PEPCID) IVPB 20 mg premix (0 mg Intravenous Stopped 07/31/19 1338)  iohexol (OMNIPAQUE) 300 MG/ML solution 100 mL (100 mLs Intravenous Contrast Given 07/31/19 1216)  alum & mag hydroxide-simeth (MAALOX/MYLANTA) 200-200-20 MG/5ML suspension 30 mL (30 mLs Oral Given 07/31/19 1251)    And  lidocaine (XYLOCAINE) 2 % viscous mouth solution 15 mL (15 mLs Oral Given 07/31/19 1251)  promethazine (PHENERGAN) injection 12.5 mg (12.5 mg Intravenous Given 07/31/19 1345)    ED Course  I have reviewed the triage vital signs and the nursing notes.  Pertinent labs & imaging results that were available during my care of the patient were reviewed by me and considered in my medical decision making (see chart for details).    MDM  Rules/Calculators/A&P                     Patient presents to the ED with complaints of abdominal pain.  Patient is nontoxic, resting comfortably, vitals without significant abnormality-BP noted to be elevated, low suspicion for hypertensive emergency.  On exam patient has some right lower quadrant and epigastric tenderness to palpation.  No peritoneal signs. Differential diagnosis includes: GERD, gastritis, PUD, cholecystitis, cholelithiasis, pancreatitis, appendicitis, perforation, obstruction, diverticulitis, IBS.  Additional history obtained:  Additional history obtained from nursing note and chart review. EKG: NSR Lab Tests:  I Ordered, reviewed, and interpreted labs, which included:  CBC: No anemia or leukocytosis. CMP: No significant electrolyte derangement.  Renal function and LFTs within normal limits. Lipase: Within normal limits Urinalysis: No UTI, small hemoglobin, will need PCP recheck. Imaging Studies ordered:  I ordered imaging studies which included CT A/P, I independently visualized and interpreted imaging which revealed an unremarkable abdominal/pelvic CT scan. No acute abdominal/pelvic findings, mass lesions or adenopathy  ED Course:  On initial evaluation of the patient fentanyl, Zofran, Pepcid, and fluids were ordered.  Following reassuring labs and CT imaging GI cocktail was ordered, patient states this made him gag some, therefore Phenergan was ordered.  Following Phenergan patient is tolerating p.o. in the emergency department.  He is feeling much better.  Repeat abdominal exam remains without peritoneal signs.  Unclear definitive etiology, will initiate PPI, Carafate, Zofran, and GI follow-up.  He also is requesting a refill of his lisinopril as he ran out, will provide 1 month supply.  I discussed results, treatment plan, need for follow-up, and return precautions with the patient. Provided opportunity for questions, patient confirmed understanding and is in agreement with  plan.   Findings and plan of care discussed with supervising physician Dr. Lockie Mola who is in agreement.   Portions of this note were generated with Scientist, clinical (histocompatibility and immunogenetics). Dictation errors may occur despite best attempts at proofreading.  Final Clinical Impression(s) / ED Diagnoses Final diagnoses:  Abdominal pain, unspecified abdominal location  Non-intractable vomiting with nausea, unspecified vomiting type    Rx / DC Orders ED Discharge Orders         Ordered    sucralfate (CARAFATE) 1 GM/10ML suspension  3 times daily with meals & bedtime     07/31/19 1422    omeprazole (PRILOSEC) 20 MG capsule  Daily     07/31/19 1422    ondansetron (ZOFRAN ODT) 4 MG disintegrating tablet  Every 8 hours PRN     07/31/19 1422    lisinopril (  ZESTRIL) 10 MG tablet  Daily     07/31/19 [address], PA-C 07/31/19 1423    Lennice Sites, DO 07/31/19 1456

## 2019-09-13 DIAGNOSIS — K047 Periapical abscess without sinus: Secondary | ICD-10-CM | POA: Insufficient documentation

## 2019-09-13 HISTORY — DX: Periapical abscess without sinus: K04.7

## 2020-07-19 IMAGING — CT CT ABD-PELV W/ CM
2 of 5 series · 16 of 46 positions shown, 18 images · IV contrast (APPLIED)
Comparison: None.

CLINICAL DATA: Right lower quadrant abdominal pain for 3 weeks.
Episodes of vomiting and diarrhea.

EXAM:
CT ABDOMEN AND PELVIS WITH CONTRAST
TECHNIQUE: Multidetector CT imaging of the abdomen and pelvis was performed
using the standard protocol following bolus administration of
intravenous contrast.
CONTRAST:  100mL OMNIPAQUE IOHEXOL 300 MG/ML  SOLN

[Series 3: abdomen 5.0 · axial · 0.80mm/px · z∈[-468,-58]mm · 13 of 96 slices shown, 15 images]
[im 7/96  soft-tissue]
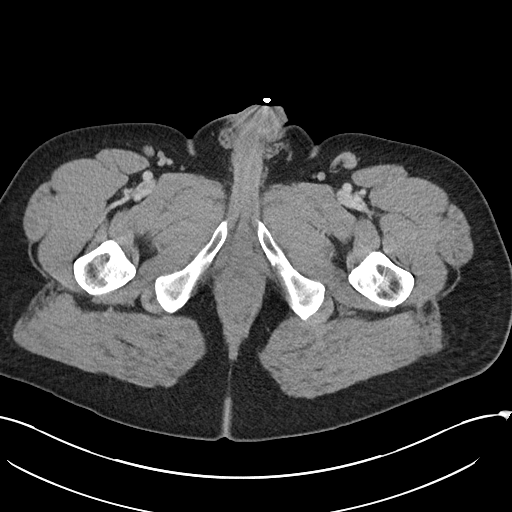
[im 7/96  bone]
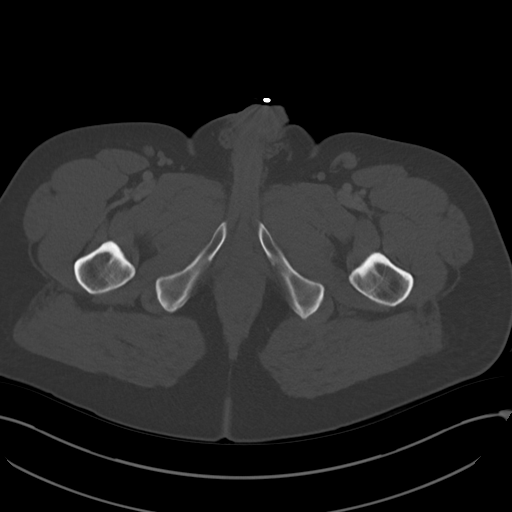
[im 14/96  soft-tissue]
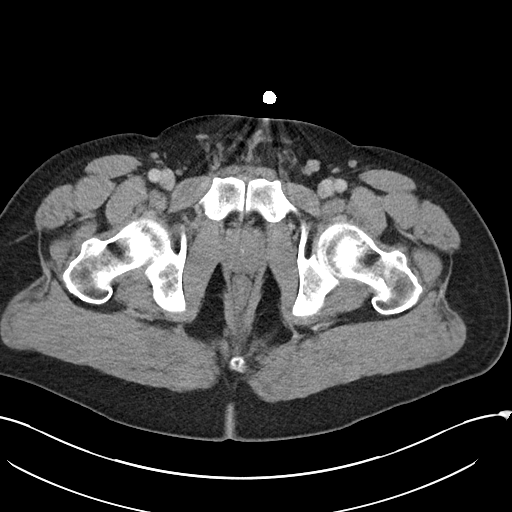
[im 21/96  soft-tissue]
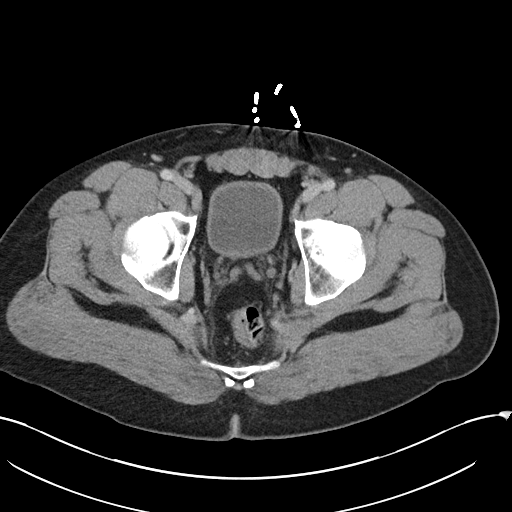
[im 28/96  soft-tissue]
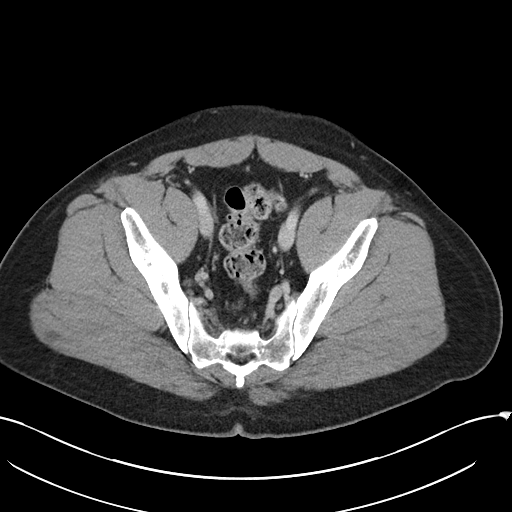
[im 34/96  soft-tissue]
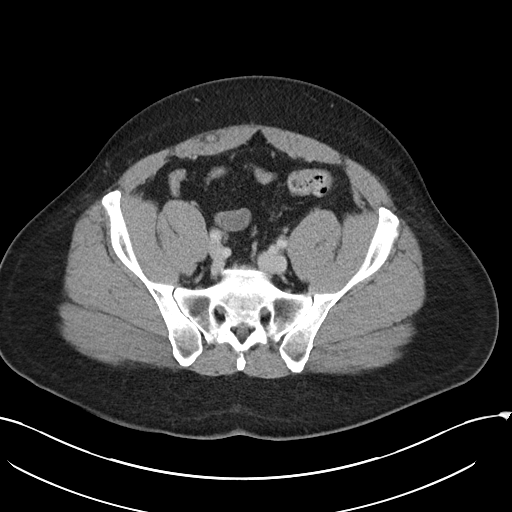
[im 41/96  soft-tissue]
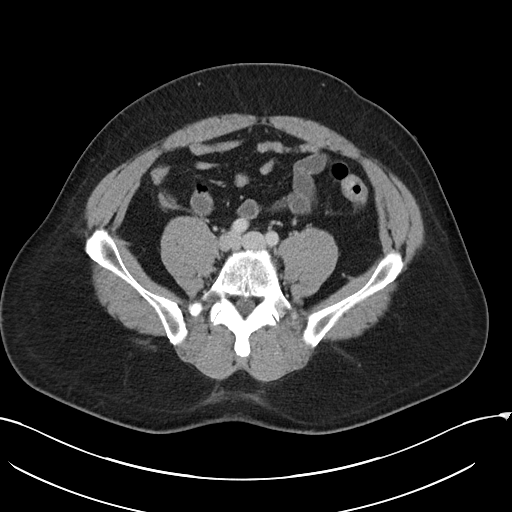
[im 48/96  soft-tissue]
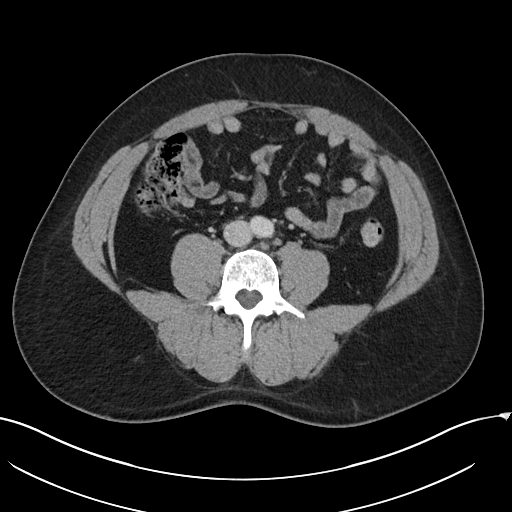
[im 55/96  soft-tissue]
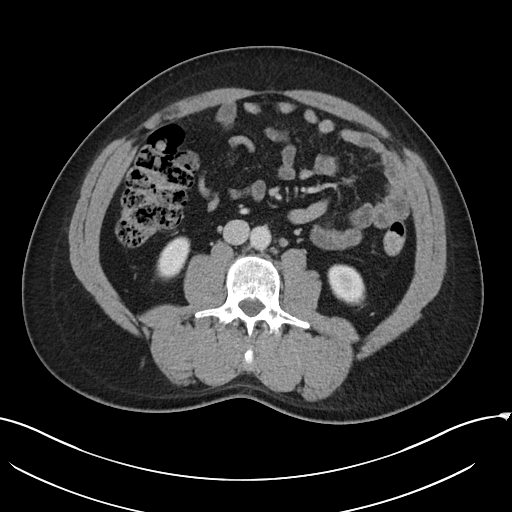
[im 62/96  soft-tissue]
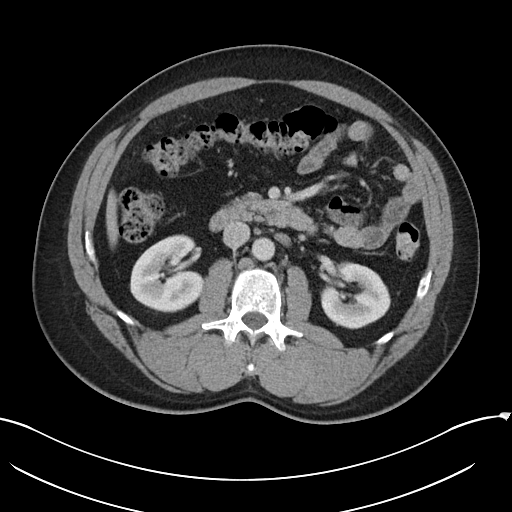
[im 62/96  bone]
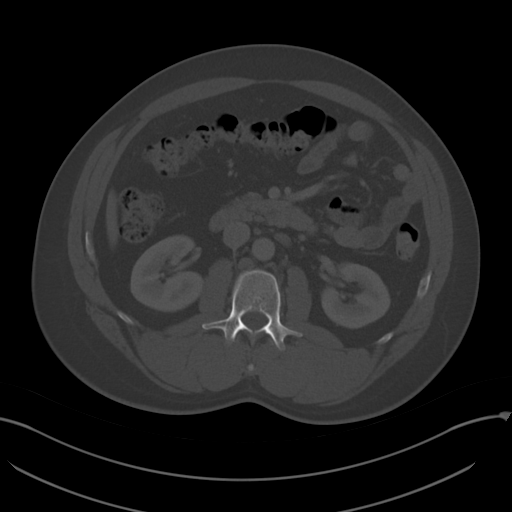
[im 68/96  soft-tissue]
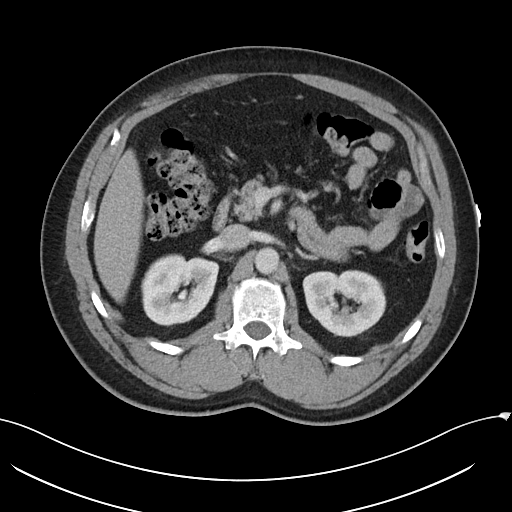
[im 75/96  soft-tissue]
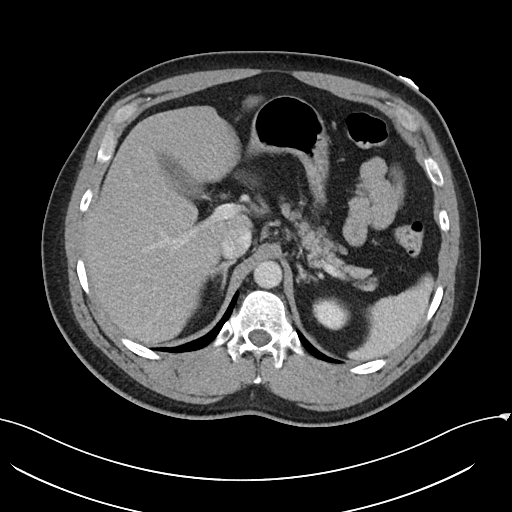
[im 82/96  soft-tissue]
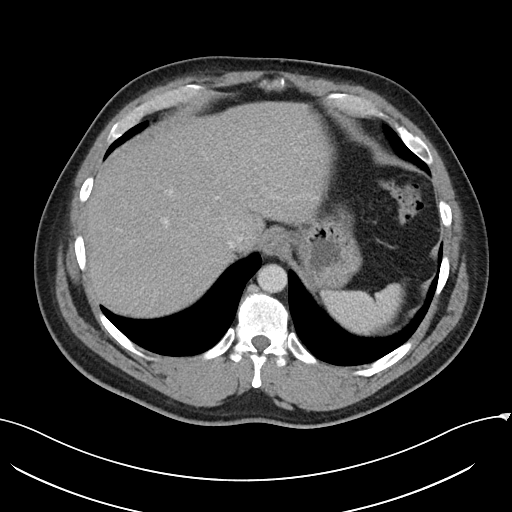
[im 89/96  soft-tissue]
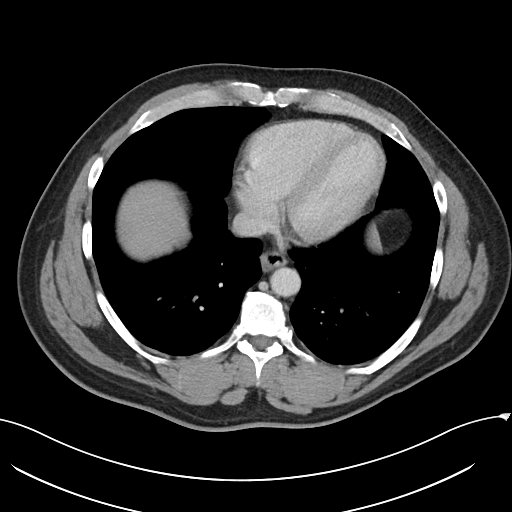

[Series 6: abdomen 3.0 mpr cor · coronal · 0.73mm/px · 3 of 108 slices shown]
[im 36/108  soft-tissue]
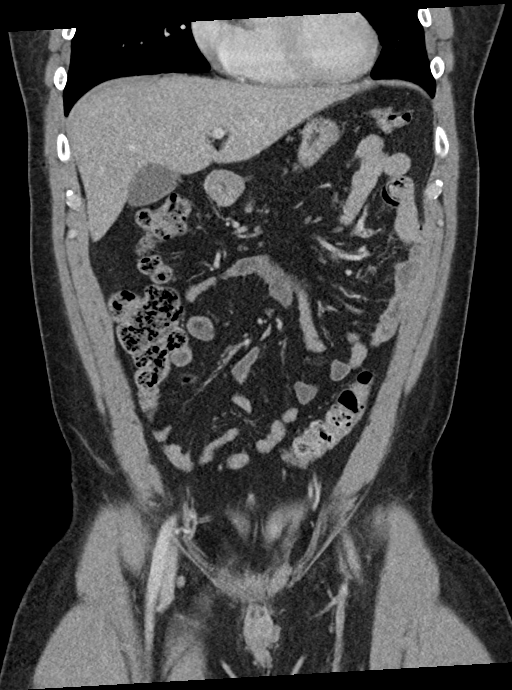
[im 48/108  soft-tissue]
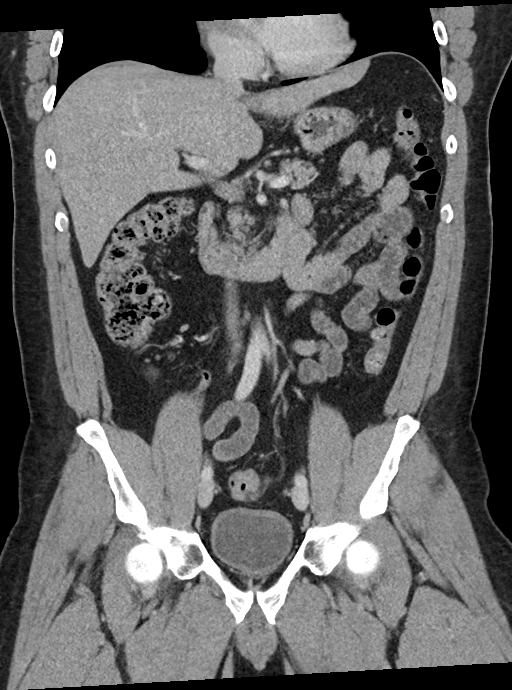
[im 60/108  soft-tissue]
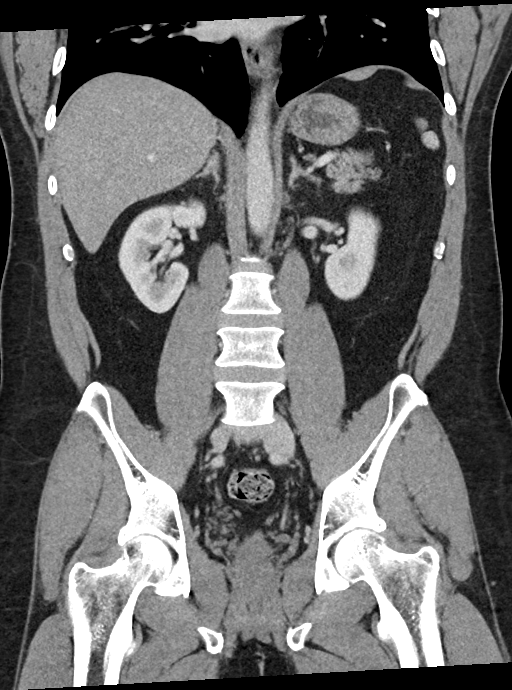

[16 of 46 positions shown; findings below may reference images not displayed]

FINDINGS: Lower chest: The lung bases are clear of acute process. No pleural
effusion or pulmonary lesions. The heart is normal in size. No
pericardial effusion. The distal esophagus and aorta are
unremarkable.

Hepatobiliary: No focal hepatic lesions or intrahepatic biliary
dilatation. The gallbladder is normal. No common bile duct
dilatation.

Pancreas: No mass, inflammation or ductal dilatation.

Spleen: Normal size. No focal lesions.

Adrenals/Urinary Tract: The adrenal glands and kidneys are normal.
The bladder is normal.

Stomach/Bowel: The stomach, duodenum, small bowel and colon are
grossly normal without oral contrast. No inflammatory changes, mass
lesions or obstructive findings. The terminal ileum is normal. The
appendix is normal.

Vascular/Lymphatic: The aorta and branch vessels are normal. The
major venous structures are patent.

Small scattered mesenteric and retroperitoneal lymph nodes but no
mass or adenopathy.

Reproductive: The prostate gland and seminal vesicles are
unremarkable.

Other: No pelvic mass or adenopathy. No free pelvic fluid
collections. No inguinal mass or adenopathy. No abdominal wall
hernia or subcutaneous lesions.

Musculoskeletal: No significant bony findings.
IMPRESSION: Unremarkable abdominal/pelvic CT scan. No acute abdominal/pelvic
findings, mass lesions or adenopathy.

## 2021-01-28 ENCOUNTER — Other Ambulatory Visit: Payer: Self-pay | Admitting: Family Medicine

## 2021-01-29 ENCOUNTER — Other Ambulatory Visit: Payer: Self-pay | Admitting: Family Medicine

## 2021-01-29 DIAGNOSIS — R3129 Other microscopic hematuria: Secondary | ICD-10-CM

## 2021-01-29 DIAGNOSIS — R109 Unspecified abdominal pain: Secondary | ICD-10-CM

## 2021-01-30 ENCOUNTER — Ambulatory Visit
Admission: RE | Admit: 2021-01-30 | Discharge: 2021-01-30 | Disposition: A | Discharge status: Home / Self Care | Payer: No Typology Code available for payment source | Source: Ambulatory Visit | Attending: Family Medicine | Admitting: Family Medicine

## 2021-01-30 DIAGNOSIS — R3129 Other microscopic hematuria: Secondary | ICD-10-CM

## 2021-01-30 DIAGNOSIS — R109 Unspecified abdominal pain: Secondary | ICD-10-CM

## 2021-06-30 ENCOUNTER — Emergency Department (HOSPITAL_COMMUNITY)
Admission: EM | Admit: 2021-06-30 | Discharge: 2021-06-30 | Discharge status: Against Medical Advice | Payer: Self-pay | Source: Home / Self Care

## 2022-01-19 IMAGING — CT CT ABD-PELV W/O CM
2 of 4 series · 17 of 46 positions shown, 19 images · non-contrast
Comparison: CT abdomen pelvis dated 07/31/2019.

CLINICAL DATA: Abdominal pain.  Hematuria.

EXAM:
CT ABDOMEN AND PELVIS WITHOUT CONTRAST
TECHNIQUE: Multidetector CT imaging of the abdomen and pelvis was performed
following the standard protocol without IV contrast.

[Series 2: renal stone 5.00 br40 s3 axial · axial · 0.88mm/px · z∈[+1245,+1665]mm · 14 of 93 slices shown, 16 images]
[im 5/93  soft-tissue]
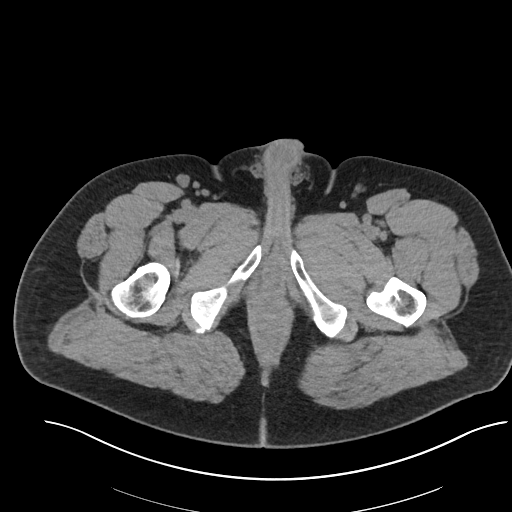
[im 5/93  bone]
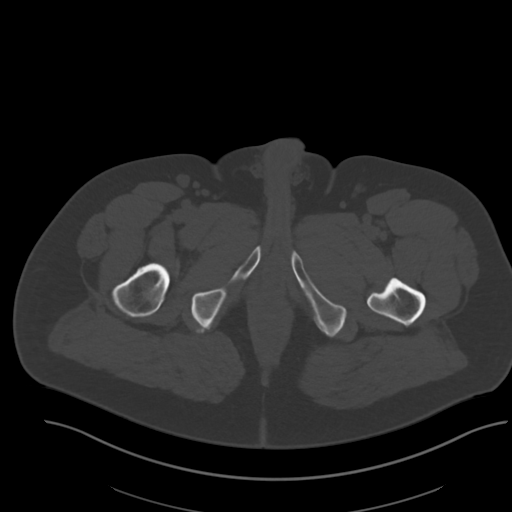
[im 13/93  soft-tissue]
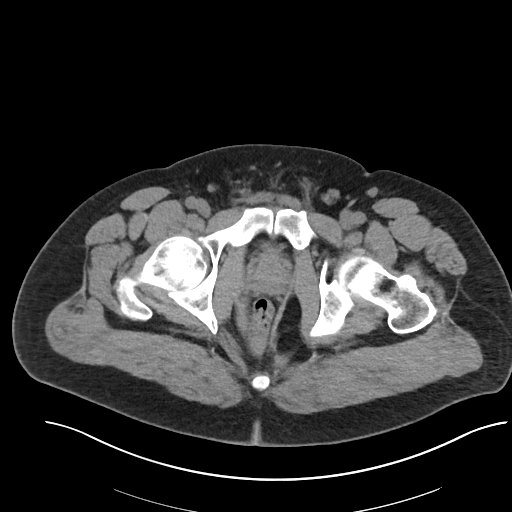
[im 17/93  soft-tissue]
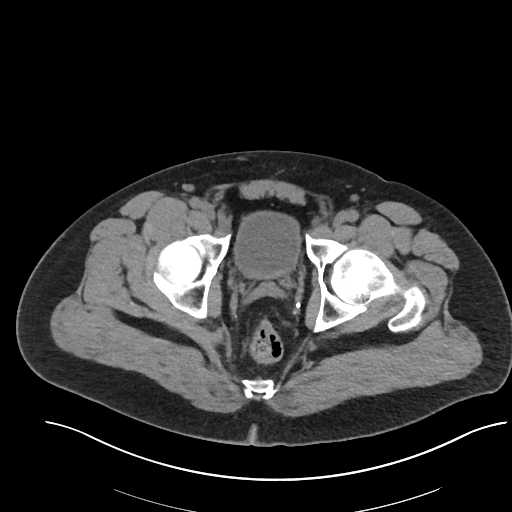
[im 25/93  soft-tissue]
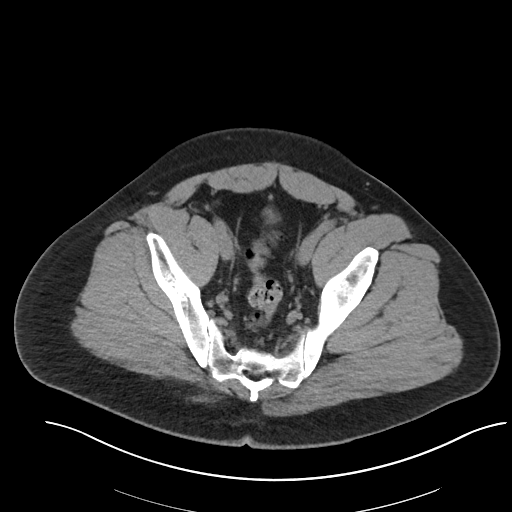
[im 33/93  soft-tissue]
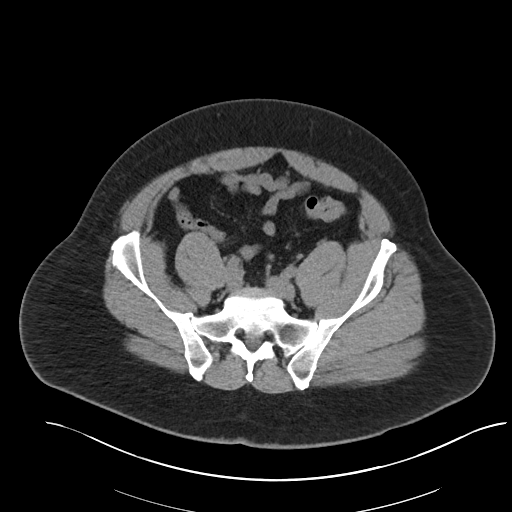
[im 37/93  soft-tissue]
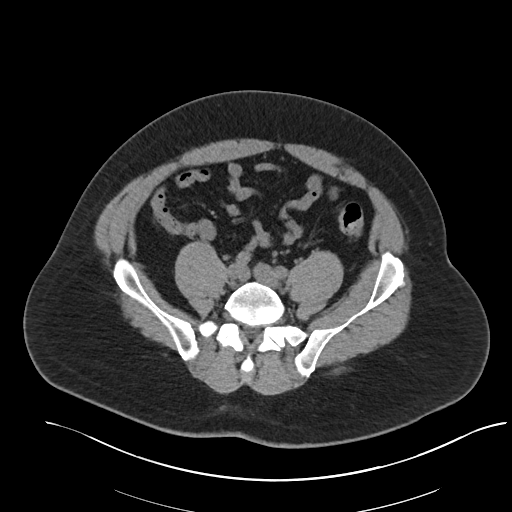
[im 45/93  soft-tissue]
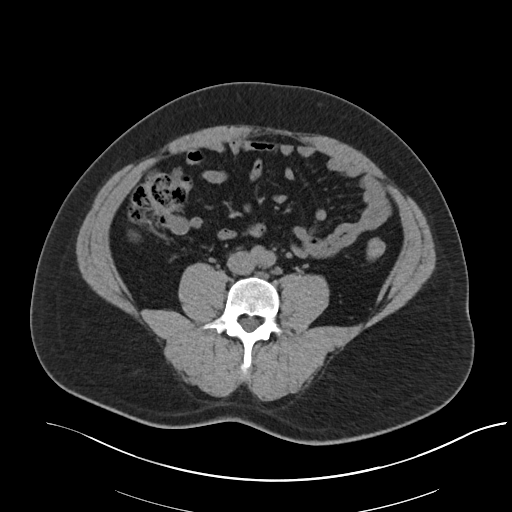
[im 49/93  soft-tissue]
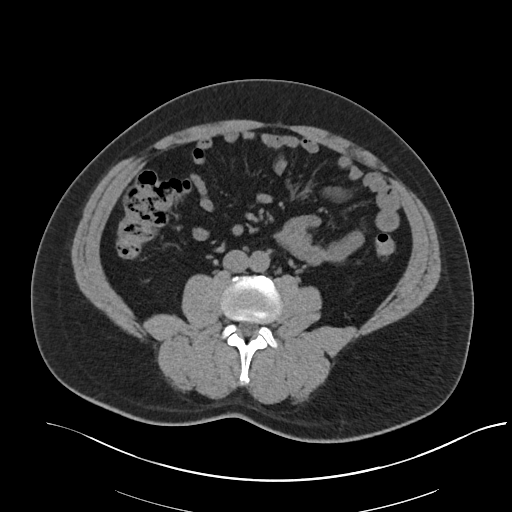
[im 57/93  soft-tissue]
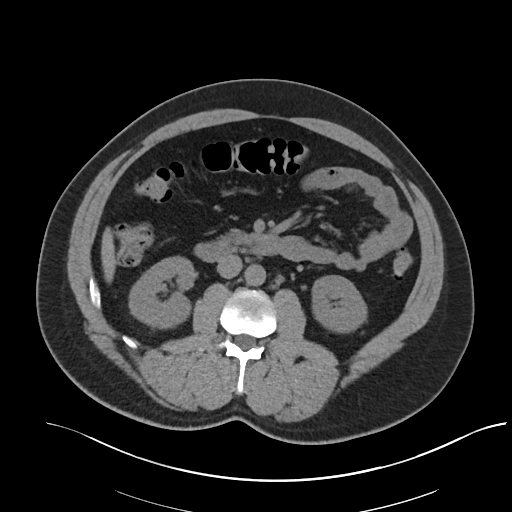
[im 57/93  bone]
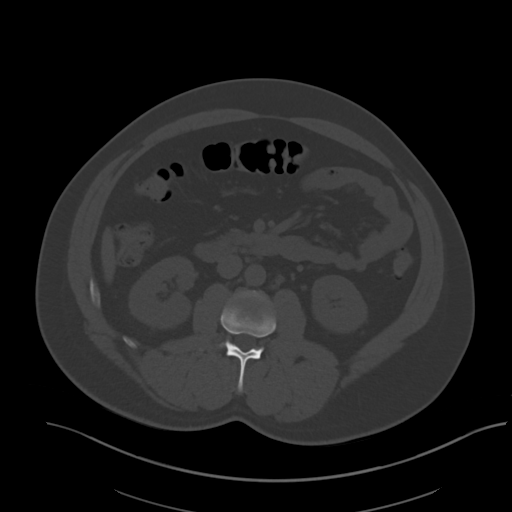
[im 61/93  soft-tissue]
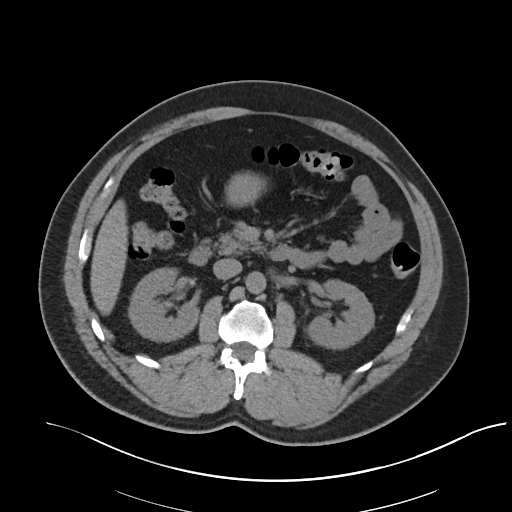
[im 69/93  soft-tissue]
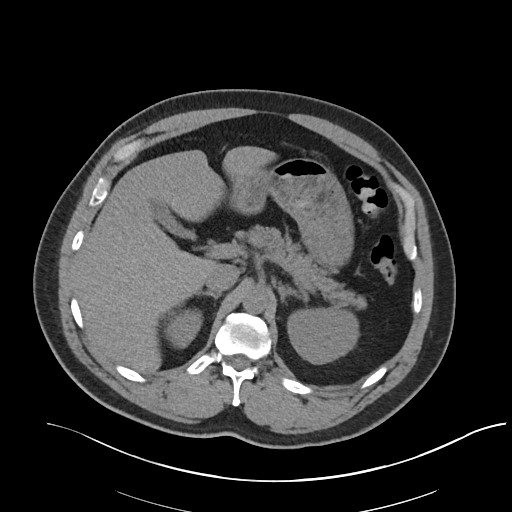
[im 77/93  soft-tissue]
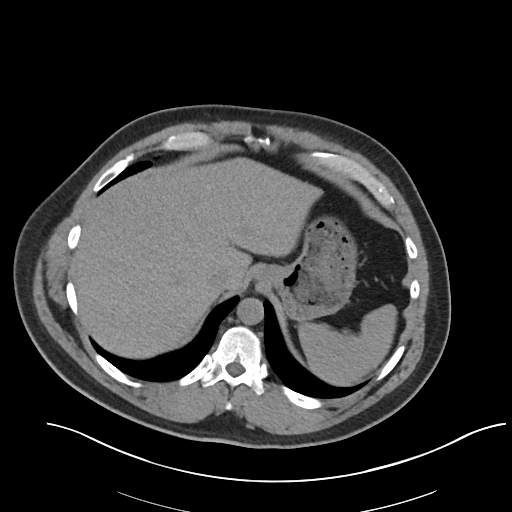
[im 81/93  soft-tissue]
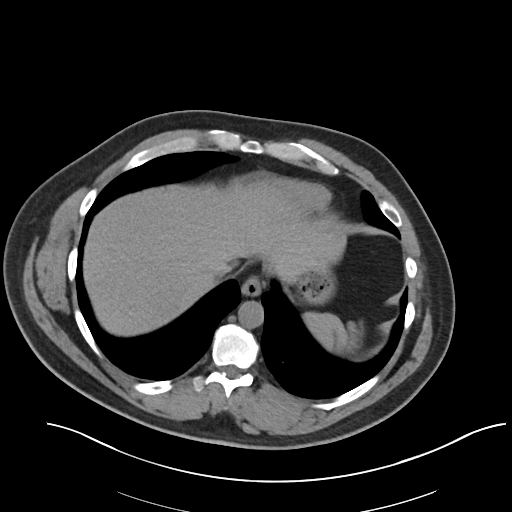
[im 89/93  soft-tissue]
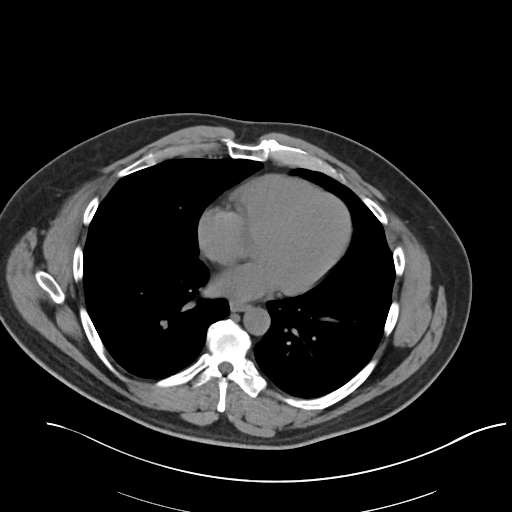

[Series 6: renal stone 2.00 br40 s3 cor · coronal · 0.88mm/px · 3 of 224 slices shown]
[im 75/224  soft-tissue]
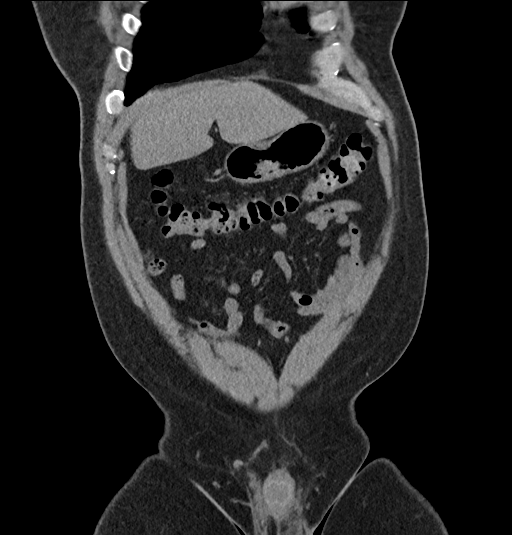
[im 100/224  soft-tissue]
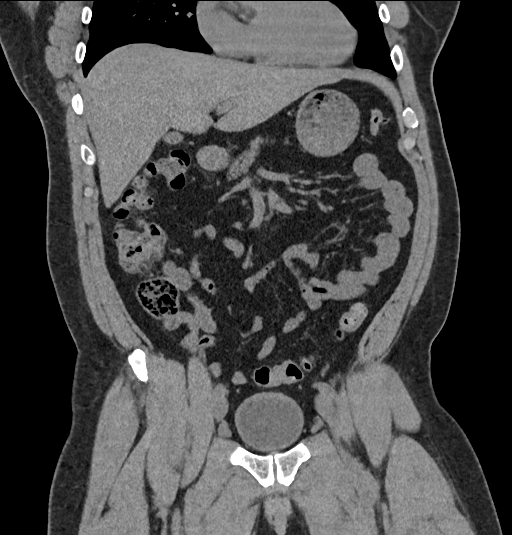
[im 124/224  soft-tissue]
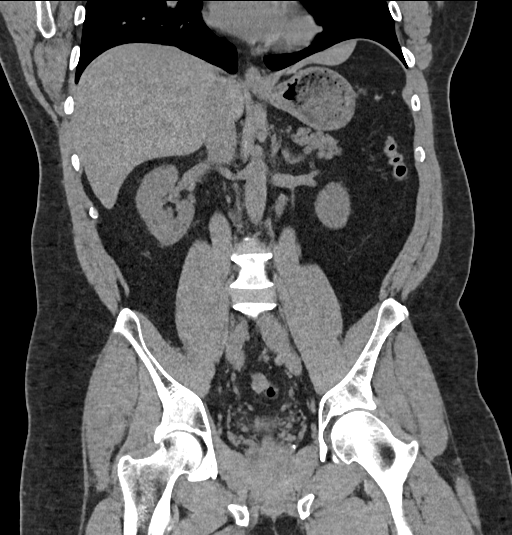

[17 of 46 positions shown; findings below may reference images not displayed]

FINDINGS: Evaluation of this exam is limited in the absence of intravenous
contrast.

Lower chest: The visualized lung bases are clear.

No intra-abdominal free air or free fluid.

Hepatobiliary: No focal liver abnormality is seen. No gallstones,
gallbladder wall thickening, or biliary dilatation.

Pancreas: Unremarkable. No pancreatic ductal dilatation or
surrounding inflammatory changes.

Spleen: Normal in size without focal abnormality.

Adrenals/Urinary Tract: The adrenal glands unremarkable. Kidneys,
visualized ureters, and urinary bladder appear unremarkable.

Stomach/Bowel: There is no bowel obstruction or active inflammation.
The appendix is normal.

Vascular/Lymphatic: The abdominal aorta and IVC are unremarkable. No
portal venous gas. There is no adenopathy.

Reproductive: The prostate and seminal vesicles are grossly
unremarkable no pelvic mass.

Other: None

Musculoskeletal: Mild degenerative changes of the spine. No acute
osseous pathology.
IMPRESSION: 1. No acute intra-abdominal or pelvic pathology. No hydronephrosis
or nephrolithiasis.
2. No bowel obstruction. Normal appendix.

## 2023-01-25 ENCOUNTER — Encounter: Payer: Self-pay | Admitting: Gastroenterology

## 2023-05-04 ENCOUNTER — Encounter (HOSPITAL_BASED_OUTPATIENT_CLINIC_OR_DEPARTMENT_OTHER): Payer: Self-pay | Admitting: Student

## 2023-05-04 ENCOUNTER — Ambulatory Visit (HOSPITAL_BASED_OUTPATIENT_CLINIC_OR_DEPARTMENT_OTHER): Payer: Self-pay | Admitting: Student

## 2023-05-04 NOTE — Progress Notes (Deleted)
 New Patient Office Visit  Subjective    Patient ID: Kevin Pennington, male    DOB: 05/07/1975  Age: 48 y.o. MRN: [id number]  CC: No chief complaint on file.   HPI Kevin Pennington presents to establish care. Prior PCP was Dr. Elton Sin at ***. Last physical was ***. he notes that he requires refills of ***.  *** Hypertension- Pt denies chest pain, SOB, dizziness, or heart palpitations.  Taking meds as directed w/o problems.  Denies medication side effects.    IBS-   Prediabetes- patient notes that he is utilizing a healthy lifestyle to address his dx of prediabetes.   Screenings:  Colon Cancer: Repeat Recommended July 2029 -(See letter from 01/25/2023) Lung Cancer: not indicated Breast Cancer: *** Diabetes: *** Ophthalmology*** Foot*** HLD: ***  The ASCVD Risk score (Arnett DK, et al., 2019) failed to calculate for the following reasons:   The systolic blood pressure is missing   Cannot find a previous HDL lab   Cannot find a previous total cholesterol lab   The smoking status is invalid  Acute Problems: ***  Outpatient Encounter Medications as of 05/04/2023  Medication Sig   lisinopril (ZESTRIL) 10 MG tablet Take 1 tablet (10 mg total) by mouth daily.   omeprazole (PRILOSEC) 20 MG capsule Take 1 capsule (20 mg total) by mouth daily.   ondansetron (ZOFRAN ODT) 4 MG disintegrating tablet Take 1 tablet (4 mg total) by mouth every 8 (eight) hours as needed for nausea or vomiting.   sucralfate (CARAFATE) 1 GM/10ML suspension Take 10 mLs (1 g total) by mouth 4 (four) times daily -  with meals and at bedtime.   Facility-Administered Encounter Medications as of 05/04/2023  Medication   0.9 %  sodium chloride infusion    Past Medical History:  Diagnosis Date   Asthma    GERD (gastroesophageal reflux disease)    Hypertension    IBS (irritable bowel syndrome)     Past Surgical History:  Procedure Laterality Date   ROOT CANAL     TONSILLECTOMY     childhood    Family  History  Problem Relation Age of Onset   Pancreatitis Mother    Colon polyps Mother    Colon cancer Neg Hx    Esophageal cancer Neg Hx    Rectal cancer Neg Hx    Stomach cancer Neg Hx     Social History   Socioeconomic History   Marital status: Married    Spouse name: Not on file   Number of children: Not on file   Years of education: Not on file   Highest education level: Not on file  Occupational History   Not on file  Tobacco Use   Smoking status: Never   Smokeless tobacco: Never  Vaping Use   Vaping status: Never Used  Substance and Sexual Activity   Alcohol use: Not Currently   Drug use: No   Sexual activity: Not on file  Other Topics Concern   Not on file  Social History Narrative   Not on file   Social Drivers of Health   Financial Resource Strain: Not on file  Food Insecurity: Not on file  Transportation Needs: Not on file  Physical Activity: Not on file  Stress: Not on file  Social Connections: Not on file  Intimate Partner Violence: Not on file    ROS  Per HPI      Objective    There were no vitals taken for this visit.  Physical Exam  {  Labs (Optional):[id number]}    Assessment & Plan:   There are no diagnoses linked to this encounter.  No follow-ups on file.   Teryl Lucy Arlis Everly, PA-C

## 2023-05-13 ENCOUNTER — Other Ambulatory Visit (HOSPITAL_BASED_OUTPATIENT_CLINIC_OR_DEPARTMENT_OTHER): Payer: Self-pay

## 2023-07-13 ENCOUNTER — Other Ambulatory Visit (HOSPITAL_BASED_OUTPATIENT_CLINIC_OR_DEPARTMENT_OTHER): Payer: Self-pay

## 2023-07-13 MED ORDER — AMOXICILLIN 250 MG PO CAPS
250.0000 mg | ORAL_CAPSULE | Freq: Three times a day (TID) | ORAL | 0 refills | Status: AC
  Filled 2023-07-13: qty 30, 10d supply, fill #0

## 2023-07-13 MED ORDER — LISINOPRIL 10 MG PO TABS
10.0000 mg | ORAL_TABLET | Freq: Every day | ORAL | 0 refills | Status: AC
  Filled 2023-07-13: qty 30, 30d supply, fill #0

## 2023-07-13 MED ORDER — MECLIZINE HCL 25 MG PO TABS: 25.0000 mg | ORAL_TABLET | Freq: Three times a day (TID) | ORAL | 0 refills | Status: AC

## 2023-07-15 ENCOUNTER — Ambulatory Visit (HOSPITAL_BASED_OUTPATIENT_CLINIC_OR_DEPARTMENT_OTHER): Payer: Self-pay | Admitting: Student

## 2024-03-13 ENCOUNTER — Other Ambulatory Visit (HOSPITAL_BASED_OUTPATIENT_CLINIC_OR_DEPARTMENT_OTHER): Payer: Self-pay

## 2024-03-13 MED ORDER — LISINOPRIL 10 MG PO TABS
10.0000 mg | ORAL_TABLET | Freq: Every day | ORAL | 1 refills | Status: AC
  Filled 2024-03-13: qty 90, 90d supply, fill #0
